# Patient Record
Sex: Female | Born: 1970 | Hispanic: No | Marital: Single | Smoking: Never smoker
Health system: Southern US, Community
[De-identification: ages and names within clinical notes are randomized; demographics above are authoritative.]

## PROBLEM LIST (undated history)

## (undated) DIAGNOSIS — F419 Anxiety disorder, unspecified: Secondary | ICD-10-CM

## (undated) HISTORY — PX: WISDOM TOOTH EXTRACTION: SHX21

---

## 1999-07-17 ENCOUNTER — Encounter: Payer: Self-pay | Admitting: Obstetrics and Gynecology

## 1999-07-17 ENCOUNTER — Encounter: Admission: RE | Admit: 1999-07-17 | Discharge: 1999-07-17 | Payer: Self-pay | Admitting: Obstetrics and Gynecology

## 1999-07-19 ENCOUNTER — Other Ambulatory Visit: Admission: RE | Admit: 1999-07-19 | Discharge: 1999-07-19 | Payer: Self-pay | Admitting: Obstetrics and Gynecology

## 1999-07-29 ENCOUNTER — Inpatient Hospital Stay (HOSPITAL_COMMUNITY): Admission: EM | Admit: 1999-07-29 | Discharge: 1999-07-29 | Payer: Self-pay | Admitting: Obstetrics and Gynecology

## 2004-09-23 ENCOUNTER — Ambulatory Visit (HOSPITAL_COMMUNITY): Admission: RE | Admit: 2004-09-23 | Discharge: 2004-09-23 | Payer: Self-pay | Admitting: Obstetrics & Gynecology

## 2004-10-25 ENCOUNTER — Ambulatory Visit (HOSPITAL_COMMUNITY): Admission: RE | Admit: 2004-10-25 | Discharge: 2004-10-25 | Payer: Self-pay | Admitting: Preventative Medicine

## 2011-08-07 ENCOUNTER — Emergency Department (HOSPITAL_COMMUNITY)
Admission: EM | Admit: 2011-08-07 | Discharge: 2011-08-07 | Disposition: A | Discharge status: Home / Self Care | Payer: Medicaid Other | Attending: Emergency Medicine | Admitting: Emergency Medicine

## 2011-08-07 ENCOUNTER — Encounter (HOSPITAL_COMMUNITY): Payer: Self-pay | Admitting: *Deleted

## 2011-08-07 DIAGNOSIS — R197 Diarrhea, unspecified: Secondary | ICD-10-CM | POA: Insufficient documentation

## 2011-08-07 DIAGNOSIS — K529 Noninfective gastroenteritis and colitis, unspecified: Secondary | ICD-10-CM

## 2011-08-07 DIAGNOSIS — R1110 Vomiting, unspecified: Secondary | ICD-10-CM | POA: Insufficient documentation

## 2011-08-07 DIAGNOSIS — R109 Unspecified abdominal pain: Secondary | ICD-10-CM | POA: Insufficient documentation

## 2011-08-07 DIAGNOSIS — R509 Fever, unspecified: Secondary | ICD-10-CM | POA: Insufficient documentation

## 2011-08-07 DIAGNOSIS — K5289 Other specified noninfective gastroenteritis and colitis: Secondary | ICD-10-CM | POA: Insufficient documentation

## 2011-08-07 LAB — CBC
HCT: 38.2 % (ref 36.0–46.0)
Hemoglobin: 12.7 g/dL (ref 12.0–15.0)
MCH: 29.4 pg (ref 26.0–34.0)
MCHC: 33.2 g/dL (ref 30.0–36.0)
MCV: 88.4 fL (ref 78.0–100.0)
Platelets: 224 10*3/uL (ref 150–400)
RBC: 4.32 MIL/uL (ref 3.87–5.11)
RDW: 13.8 % (ref 11.5–15.5)
WBC: 10.8 10*3/uL — ABNORMAL HIGH (ref 4.0–10.5)

## 2011-08-07 LAB — DIFFERENTIAL
Basophils Absolute: 0 10*3/uL (ref 0.0–0.1)
Basophils Relative: 0 % (ref 0–1)
Eosinophils Absolute: 0 10*3/uL (ref 0.0–0.7)
Eosinophils Relative: 0 % (ref 0–5)
Lymphocytes Relative: 6 % — ABNORMAL LOW (ref 12–46)
Lymphs Abs: 0.7 10*3/uL (ref 0.7–4.0)
Monocytes Absolute: 0.3 10*3/uL (ref 0.1–1.0)
Monocytes Relative: 2 % — ABNORMAL LOW (ref 3–12)
Neutro Abs: 9.9 10*3/uL — ABNORMAL HIGH (ref 1.7–7.7)
Neutrophils Relative %: 92 % — ABNORMAL HIGH (ref 43–77)

## 2011-08-07 LAB — COMPREHENSIVE METABOLIC PANEL
AST: 56 U/L — ABNORMAL HIGH (ref 0–37)
CO2: 26 mEq/L (ref 19–32)
Calcium: 9.5 mg/dL (ref 8.4–10.5)
Creatinine, Ser: 0.74 mg/dL (ref 0.50–1.10)
GFR calc Af Amer: >90 mL/min (ref >90)
GFR calc non Af Amer: >90 mL/min (ref >90)
Sodium: 136 mEq/L (ref 135–145)
Total Protein: 7.2 g/dL (ref 6.0–8.3)

## 2011-08-07 LAB — LIPASE, BLOOD: Lipase: 13 U/L (ref 11–59)

## 2011-08-07 MED ORDER — ONDANSETRON HCL 4 MG/2ML IJ SOLN
4.0000 mg | Freq: Once | INTRAMUSCULAR | Status: AC
  Administered 2011-08-07: 4 mg via INTRAVENOUS
  Filled 2011-08-07: qty 2

## 2011-08-07 MED ORDER — PANTOPRAZOLE SODIUM 40 MG IV SOLR
40.0000 mg | Freq: Once | INTRAVENOUS | Status: AC
Start: 2011-08-07 — End: 2011-08-07
  Administered 2011-08-07: 40 mg via INTRAVENOUS
  Filled 2011-08-07: qty 40

## 2011-08-07 MED ORDER — ACETAMINOPHEN 500 MG PO TABS
1000.0000 mg | ORAL_TABLET | Freq: Once | ORAL | Status: AC
  Administered 2011-08-07: 1000 mg via ORAL
  Filled 2011-08-07: qty 2

## 2011-08-07 MED ORDER — TRAMADOL HCL 50 MG PO TABS: 50.0000 mg | ORAL_TABLET | Freq: Four times a day (QID) | ORAL | Status: AC | PRN

## 2011-08-07 MED ORDER — PROMETHAZINE HCL 25 MG PO TABS: 25.0000 mg | ORAL_TABLET | Freq: Four times a day (QID) | ORAL | Status: AC | PRN

## 2011-08-07 MED ORDER — RANITIDINE HCL 150 MG PO CAPS: 150.0000 mg | ORAL_CAPSULE | Freq: Two times a day (BID) | ORAL | Status: DC

## 2011-08-07 MED ORDER — HYDROMORPHONE HCL PF 1 MG/ML IJ SOLN
1.0000 mg | Freq: Once | INTRAMUSCULAR | Status: AC
  Administered 2011-08-07: 1 mg via INTRAVENOUS
  Filled 2011-08-07: qty 1

## 2011-08-07 MED ORDER — SODIUM CHLORIDE 0.9 % IV BOLUS (SEPSIS)
1000.0000 mL | Freq: Once | INTRAVENOUS | Status: AC
  Administered 2011-08-07: 1000 mL via INTRAVENOUS

## 2011-08-07 NOTE — Discharge Instructions (Signed)
Follow up with your md next week if necessary

## 2011-08-07 NOTE — ED Notes (Signed)
Vomiting and diarrhea. Onset this am.  abd and chest pain

## 2011-08-07 NOTE — ED Provider Notes (Signed)
History    This chart was scribed for Benny Lennert, MD, MD by Smitty Pluck. The patient was seen in room APA12 and the patient's care was started at 6:58PM.   CSN: [id number]  Arrival date & time 08/07/11  1827   First MD Initiated Contact with Patient 08/07/11 1855      Chief Complaint  Patient presents with  . Emesis    (Consider location/radiation/quality/duration/timing/severity/associated sxs/prior treatment) Patient is a 41 y.o. female presenting with vomiting and diarrhea. The history is provided by the patient.  Emesis  This is a new problem. The current episode started 6 to 12 hours ago. The problem has not changed since onset.The fever has been present for less than 1 day. Associated symptoms include chills, diarrhea and a fever.  Diarrhea The primary symptoms include fever, vomiting and diarrhea. The illness began today. The onset was sudden. The problem has not changed since onset. The diarrhea began today. The diarrhea is watery. The diarrhea occurs 2 to 4 times per day.  The illness is also significant for chills.   Brandi Cross is a 41 y.o. female who presents to the Emergency Department complaining of persistent emesis onset today. Pt reports that she had fever and chills. Pt has had diarrhea, moderate abdominal pain and aching chest pain. Pt has not taken medication for pain. She reports that she has not eaten any food today. She denies ill contacts. The symptoms have been constant since onset without radiation.  PCP is Dr. Augusto Gamble   History reviewed. No pertinent past medical history.  History reviewed. No pertinent past surgical history.  History reviewed. No pertinent family history.  History  Substance Use Topics  . Smoking status: Never Smoker   . Smokeless tobacco: Not on file  . Alcohol Use: No    OB History    Grav Para Term Preterm Abortions TAB SAB Ect Mult Living                  Review of Systems  Constitutional: Positive for fever and  chills.  Gastrointestinal: Positive for vomiting and diarrhea.  All other systems reviewed and are negative.  10 Systems reviewed and are negative for acute change except as noted in the HPI.   Allergies  Review of patient's allergies indicates no known allergies.  Home Medications  No current outpatient prescriptions on file.  BP 127/85  Pulse 110  Temp(Src) 98.9 F (37.2 C) (Oral)  Resp 20  Ht 5\' 3"  (1.6 m)  Wt 172 lb (78.019 kg)  BMI 30.47 kg/m2  SpO2 99%  LMP 07/21/2011  Physical Exam  Constitutional: She is oriented to person, place, and time. She appears well-developed.  HENT:  Head: Normocephalic and atraumatic.  Eyes: Conjunctivae and EOM are normal. No scleral icterus.  Neck: Neck supple. No thyromegaly present.  Cardiovascular: Normal rate and regular rhythm.  Exam reveals no gallop and no friction rub.   No murmur heard. Pulmonary/Chest: No stridor. She has no wheezes. She has no rales. She exhibits no tenderness.  Abdominal: She exhibits no distension. There is tenderness (mild epigastric discomfort ). There is no rebound.  Musculoskeletal: Normal range of motion. She exhibits no edema.  Lymphadenopathy:    She has no cervical adenopathy.  Neurological: She is oriented to person, place, and time. Coordination normal.  Skin: No rash noted. No erythema.  Psychiatric: She has a normal mood and affect. Her behavior is normal.    ED Course  Procedures (including critical  care time) DIAGNOSTIC STUDIES: Oxygen Saturation is 99% on room air, normal by my interpretation.    COORDINATION OF CARE: 7:03PM EDP discusses pt treatment course with pt and pt family  7:03PM EDP orders medication: NaCl 0.9% bolus, Zofran 4 mg, Dilaudid 1 mg, Protonix 40 mg     Labs Reviewed  CBC - Abnormal; Notable for the following:    WBC 10.8 (*)    All other components within normal limits  DIFFERENTIAL - Abnormal; Notable for the following:    Neutrophils Relative 92 (*)     Neutro Abs 9.9 (*)    Lymphocytes Relative 6 (*)    Monocytes Relative 2 (*)    All other components within normal limits  COMPREHENSIVE METABOLIC PANEL - Abnormal; Notable for the following:    Glucose, Bld 105 (*)    AST 56 (*)    ALT 64 (*)    All other components within normal limits  LIPASE, BLOOD   No results found.   No diagnosis found.    MDM   The chart was scribed for me under my direct supervision.  I personally performed the history, physical, and medical decision making and all procedures in the evaluation of this patient.Benny Lennert, MD 08/07/11 2134

## 2012-01-17 ENCOUNTER — Emergency Department (HOSPITAL_COMMUNITY)
Admission: EM | Admit: 2012-01-17 | Discharge: 2012-01-17 | Disposition: A | Discharge status: Home / Self Care | Payer: Medicaid Other | Attending: Emergency Medicine | Admitting: Emergency Medicine

## 2012-01-17 ENCOUNTER — Encounter (HOSPITAL_COMMUNITY): Payer: Self-pay

## 2012-01-17 DIAGNOSIS — N61 Mastitis without abscess: Secondary | ICD-10-CM | POA: Insufficient documentation

## 2012-01-17 DIAGNOSIS — Z79899 Other long term (current) drug therapy: Secondary | ICD-10-CM | POA: Insufficient documentation

## 2012-01-17 HISTORY — DX: Anxiety disorder, unspecified: F41.9

## 2012-01-17 LAB — BASIC METABOLIC PANEL
CO2: 27 mEq/L (ref 19–32)
Chloride: 93 mEq/L — ABNORMAL LOW (ref 96–112)
Glucose, Bld: 103 mg/dL — ABNORMAL HIGH (ref 70–99)
Sodium: 129 mEq/L — ABNORMAL LOW (ref 135–145)

## 2012-01-17 LAB — CBC WITH DIFFERENTIAL/PLATELET
Eosinophils Relative: 0 % (ref 0–5)
HCT: 37 % (ref 36.0–46.0)
Hemoglobin: 12.1 g/dL (ref 12.0–15.0)
Lymphocytes Relative: 9 % — ABNORMAL LOW (ref 12–46)
Lymphs Abs: 1.4 10*3/uL (ref 0.7–4.0)
MCV: 87.7 fL (ref 78.0–100.0)
Monocytes Absolute: 0.6 10*3/uL (ref 0.1–1.0)
Monocytes Relative: 4 % (ref 3–12)
RBC: 4.22 MIL/uL (ref 3.87–5.11)
WBC: 16.6 10*3/uL — ABNORMAL HIGH (ref 4.0–10.5)

## 2012-01-17 LAB — LACTIC ACID, PLASMA: Lactic Acid, Venous: 0.6 mmol/L (ref 0.5–2.2)

## 2012-01-17 LAB — POCT I-STAT TROPONIN I: Troponin i, poc: 0 ng/mL (ref 0.00–0.08)

## 2012-01-17 MED ORDER — METOCLOPRAMIDE HCL 10 MG PO TABS: 10.0000 mg | ORAL_TABLET | Freq: Four times a day (QID) | ORAL | Status: DC | PRN

## 2012-01-17 MED ORDER — OXYCODONE-ACETAMINOPHEN 5-325 MG PO TABS: 2.0000 | ORAL_TABLET | ORAL | Status: AC | PRN

## 2012-01-17 MED ORDER — ONDANSETRON 8 MG PO TBDP: ORAL_TABLET | ORAL | Status: AC

## 2012-01-17 MED ORDER — SULFAMETHOXAZOLE-TRIMETHOPRIM 800-160 MG PO TABS: 1.0000 | ORAL_TABLET | Freq: Two times a day (BID) | ORAL | Status: AC

## 2012-01-17 MED ORDER — VANCOMYCIN HCL IN DEXTROSE 1-5 GM/200ML-% IV SOLN
1000.0000 mg | Freq: Once | INTRAVENOUS | Status: AC
  Administered 2012-01-17: 1000 mg via INTRAVENOUS
  Filled 2012-01-17: qty 200

## 2012-01-17 MED ORDER — SODIUM CHLORIDE 0.9 % IV SOLN: INTRAVENOUS | Status: DC

## 2012-01-17 MED ORDER — SODIUM CHLORIDE 0.9 % IV BOLUS (SEPSIS)
2000.0000 mL | Freq: Once | INTRAVENOUS | Status: AC
  Administered 2012-01-17: 2000 mL via INTRAVENOUS

## 2012-01-17 MED ORDER — CEPHALEXIN 500 MG PO CAPS: ORAL_CAPSULE | ORAL | Status: AC

## 2012-01-17 MED ORDER — ACETAMINOPHEN 325 MG PO TABS
650.0000 mg | ORAL_TABLET | Freq: Once | ORAL | Status: AC
  Administered 2012-01-17: 650 mg via ORAL
  Filled 2012-01-17: qty 2

## 2012-01-17 MED ORDER — ONDANSETRON HCL 4 MG/2ML IJ SOLN
4.0000 mg | Freq: Once | INTRAMUSCULAR | Status: AC
  Administered 2012-01-17: 4 mg via INTRAVENOUS
  Filled 2012-01-17: qty 2

## 2012-01-17 MED ORDER — HYDROMORPHONE HCL PF 1 MG/ML IJ SOLN
1.0000 mg | Freq: Once | INTRAMUSCULAR | Status: AC
  Administered 2012-01-17: 1 mg via INTRAVENOUS
  Filled 2012-01-17: qty 1

## 2012-01-17 NOTE — ED Notes (Signed)
Pt reports having left breast pain since last night, pain never goes away, +chills, +nausea and vomiting.   Denies any known injury to area. Denies sob.

## 2012-01-17 NOTE — ED Notes (Signed)
Pt c/o pain to left breast x1 day. Pt states pain is constant. Left breast has redness and is warm to touch.

## 2012-01-17 NOTE — ED Provider Notes (Signed)
History     CSN: [id number]  Arrival date & time 01/17/12  1352   First MD Initiated Contact with Patient 01/17/12 1423      Chief Complaint  Patient presents with  . Breast Pain    (Consider location/radiation/quality/duration/timing/severity/associated sxs/prior treatment) HPI This 41 year old female woke up this morning and for the morning with shaking chills, generalized body aches, redness swelling tenderness and firmness to her left breast soft tissue, and some nausea with one episode of nonbloody vomiting. She no diarrhea no abdominal pain no chest pain other than her left breast soft tissue only, no cough or shortness breath, no lightheadedness or altered mental status or headache, no rashes other than her left breast skin. There is no treatment prior to arrival. She has had constant moderately severe left breast sharp tender nonradiating localized pain since 4:00 this morning for over 10 hours now. There is no pleuritic or exertional discomfort. Past Medical History  Diagnosis Date  . Anxiety   . Attention deficit     History reviewed. No pertinent past surgical history.  No family history on file.  History  Substance Use Topics  . Smoking status: Never Smoker   . Smokeless tobacco: Not on file  . Alcohol Use: No    OB History    Grav Para Term Preterm Abortions TAB SAB Ect Mult Living                  Review of Systems 10 Systems reviewed and are negative for acute change except as noted in the HPI. Allergies  Review of patient's allergies indicates no known allergies.  Home Medications   Current Outpatient Rx  Name Route Sig Dispense Refill  . NAPROXEN SODIUM 220 MG PO TABS Oral Take 440 mg by mouth as needed. pain    . CEPHALEXIN 500 MG PO CAPS  2 caps po bid x 7 days 28 capsule 0  . METOCLOPRAMIDE HCL 10 MG PO TABS Oral Take 1 tablet (10 mg total) by mouth every 6 (six) hours as needed (nausea/headache). 6 tablet 0  . ONDANSETRON 8 MG PO TBDP  8mg  ODT  q4 hours prn nausea 4 tablet 0  . OXYCODONE-ACETAMINOPHEN 5-325 MG PO TABS Oral Take 2 tablets by mouth every 4 (four) hours as needed for pain. 20 tablet 0  . SULFAMETHOXAZOLE-TRIMETHOPRIM 800-160 MG PO TABS Oral Take 1 tablet by mouth 2 (two) times daily. One po bid x 7 days 14 tablet 0    BP 130/104  Pulse 118  Temp 98.4 F (36.9 C) (Oral)  Resp 20  Ht 5\' 3"  (1.6 m)  Wt 163 lb (73.936 kg)  BMI 28.87 kg/m2  SpO2 100%  LMP 12/25/2011  Physical Exam  Nursing note and vitals reviewed. Constitutional:       Awake, alert, nontoxic appearance.  HENT:  Head: Atraumatic.  Eyes: Right eye exhibits no discharge. Left eye exhibits no discharge.  Neck: Neck supple.  Cardiovascular: Regular rhythm.   No murmur heard.      Tachycardic  Pulmonary/Chest: Effort normal and breath sounds normal. No respiratory distress. She has no wheezes. She has no rales. She exhibits no tenderness.       Chaperone is present for breast examination revealing nontender right breast, left breast has erythema induration warmth tenderness firmness without fluctuance and limited bedside emergency ultrasound reveals no obvious soft tissue abscess  Abdominal: Soft. There is no tenderness. There is no rebound.  Musculoskeletal: She exhibits no edema and no tenderness.  Baseline ROM, no obvious new focal weakness.  Neurological: She is alert.       Mental status and motor strength appears baseline for patient and situation.  Skin: No rash noted.  Psychiatric: She has a normal mood and affect.    ED Course  Procedures (including critical care time) ECG: Sinus tachycardia, ventricular rate 109, normal axis, normal intervals, diffuse inverted T waves anterolateral leads with no comparison ECG available Labs Reviewed  BASIC METABOLIC PANEL - Abnormal; Notable for the following:    Sodium 129 (*)     Chloride 93 (*)     Glucose, Bld 103 (*)     All other components within normal limits  CBC WITH DIFFERENTIAL  - Abnormal; Notable for the following:    WBC 16.6 (*)     Neutrophils Relative 88 (*)     Neutro Abs 14.5 (*)     Lymphocytes Relative 9 (*)     All other components within normal limits  CULTURE, BLOOD (ROUTINE X 2)  CULTURE, BLOOD (ROUTINE X 2)  LACTIC ACID, PLASMA  POCT I-STAT TROPONIN I   No results found.   1. Cellulitis of breast       MDM  Pt stable in ED with no significant deterioration in condition.Patient / Family / Caregiver informed of clinical course, understand medical decision-making process, and agree with plan.Doubt sepsis/abscess.        Hurman Horn, MD 01/17/12 [phone]

## 2012-01-22 LAB — CULTURE, BLOOD (ROUTINE X 2): Culture: NO GROWTH

## 2012-07-11 ENCOUNTER — Encounter (HOSPITAL_COMMUNITY): Payer: Self-pay | Admitting: Emergency Medicine

## 2012-07-11 ENCOUNTER — Emergency Department (HOSPITAL_COMMUNITY)
Admission: EM | Admit: 2012-07-11 | Discharge: 2012-07-12 | Disposition: A | Discharge status: Home / Self Care | Payer: Medicaid Other | Attending: Emergency Medicine | Admitting: Emergency Medicine

## 2012-07-11 DIAGNOSIS — Y939 Activity, unspecified: Secondary | ICD-10-CM | POA: Insufficient documentation

## 2012-07-11 DIAGNOSIS — T50901A Poisoning by unspecified drugs, medicaments and biological substances, accidental (unintentional), initial encounter: Secondary | ICD-10-CM

## 2012-07-11 DIAGNOSIS — T424X4A Poisoning by benzodiazepines, undetermined, initial encounter: Secondary | ICD-10-CM | POA: Insufficient documentation

## 2012-07-11 DIAGNOSIS — T424X1A Poisoning by benzodiazepines, accidental (unintentional), initial encounter: Secondary | ICD-10-CM | POA: Insufficient documentation

## 2012-07-11 DIAGNOSIS — R41840 Attention and concentration deficit: Secondary | ICD-10-CM | POA: Insufficient documentation

## 2012-07-11 DIAGNOSIS — Y929 Unspecified place or not applicable: Secondary | ICD-10-CM | POA: Insufficient documentation

## 2012-07-11 DIAGNOSIS — F411 Generalized anxiety disorder: Secondary | ICD-10-CM | POA: Insufficient documentation

## 2012-07-11 LAB — CBC WITH DIFFERENTIAL/PLATELET
Basophils Relative: 1 % (ref 0–1)
Eosinophils Absolute: 0.1 10*3/uL (ref 0.0–0.7)
Eosinophils Relative: 1 % (ref 0–5)
HCT: 36.3 % (ref 36.0–46.0)
Hemoglobin: 11.8 g/dL — ABNORMAL LOW (ref 12.0–15.0)
Lymphs Abs: 2.3 10*3/uL (ref 0.7–4.0)
MCH: 28.7 pg (ref 26.0–34.0)
MCHC: 32.5 g/dL (ref 30.0–36.0)
MCV: 88.3 fL (ref 78.0–100.0)
Monocytes Absolute: 0.3 10*3/uL (ref 0.1–1.0)
Monocytes Relative: 5 % (ref 3–12)
Neutrophils Relative %: 53 % (ref 43–77)

## 2012-07-11 LAB — BASIC METABOLIC PANEL
BUN: 13 mg/dL (ref 6–23)
CO2: 26 mEq/L (ref 19–32)
GFR calc non Af Amer: >90 mL/min (ref >90)
Glucose, Bld: 104 mg/dL — ABNORMAL HIGH (ref 70–99)
Potassium: 3.5 mEq/L (ref 3.5–5.1)

## 2012-07-11 NOTE — ED Provider Notes (Signed)
History     CSN: [id number]  Arrival date & time 07/11/12  2218   First MD Initiated Contact with Patient 07/11/12 2306      Chief Complaint  Patient presents with  . Drug Overdose    (Consider location/radiation/quality/duration/timing/severity/associated sxs/prior treatment) HPI Brandi Cross is a 42 y.o. female who presents to the Emergency Department complaining of overdose of ativan (two pills) and melatonin (six pills) in an attempt to sleep. She got into a fight with a boyfriend and decided the best way to treat it was to go to sleep. She has a h/o anxiety. She denies suicidal ideation, homicidal ideation and is not having AVH.  PCP Hasanaj  Past Medical History  Diagnosis Date  . Anxiety   . Attention deficit     History reviewed. No pertinent past surgical history.  No family history on file.  History  Substance Use Topics  . Smoking status: Never Smoker   . Smokeless tobacco: Not on file  . Alcohol Use: No    OB History   Grav Para Term Preterm Abortions TAB SAB Ect Mult Living                  Review of Systems  Constitutional: Negative for fever.       10 Systems reviewed and are negative for acute change except as noted in the HPI.  HENT: Negative for congestion.   Eyes: Negative for discharge and redness.  Respiratory: Negative for cough and shortness of breath.   Cardiovascular: Negative for chest pain.  Gastrointestinal: Negative for vomiting and abdominal pain.  Musculoskeletal: Negative for back pain.  Skin: Negative for rash.  Neurological: Negative for syncope, numbness and headaches.  Psychiatric/Behavioral:       No behavior change.    Allergies  Review of patient's allergies indicates no known allergies.  Home Medications   Current Outpatient Rx  Name  Route  Sig  Dispense  Refill  . EXPIRED: metoCLOPramide (REGLAN) 10 MG tablet   Oral   Take 1 tablet (10 mg total) by mouth every 6 (six) hours as needed (nausea/headache).   6  tablet   0   . naproxen sodium (ALEVE) 220 MG tablet   Oral   Take 440 mg by mouth as needed. pain           BP 128/88  Pulse 79  Temp(Src) 97.9 F (36.6 C) (Oral)  Resp 18  SpO2 99%  Physical Exam  Nursing note and vitals reviewed. Constitutional:  Awake, alert, nontoxic appearance.  HENT:  Head: Atraumatic.  Eyes: Right eye exhibits no discharge. Left eye exhibits no discharge.  Neck: Neck supple.  Cardiovascular: Normal rate.   Pulmonary/Chest: Effort normal and breath sounds normal. She exhibits no tenderness.  Abdominal: Soft. Bowel sounds are normal. There is no tenderness. There is no rebound.  Musculoskeletal: She exhibits no tenderness.  Baseline ROM, no obvious new focal weakness.  Neurological:  Mental status and motor strength appears baseline for patient and situation.  Skin: No rash noted.  Psychiatric: She has a normal mood and affect.  Denies suicidal idation, homicidal ideation, AVH    ED Course  Procedures (including critical care time)  Results for orders placed during the hospital encounter of 07/11/12  BASIC METABOLIC PANEL      Result Value Range   Sodium 139  135 - 145 mEq/L   Potassium 3.5  3.5 - 5.1 mEq/L   Chloride 104  96 - 112 mEq/L  CO2 26  19 - 32 mEq/L   Glucose, Bld 104 (*) 70 - 99 mg/dL   BUN 13  6 - 23 mg/dL   Creatinine, Ser 1.61  0.50 - 1.10 mg/dL   Calcium 9.3  8.4 - 09.6 mg/dL   GFR calc non Af Amer >90  >90 mL/min   GFR calc Af Amer >90  >90 mL/min  CBC WITH DIFFERENTIAL      Result Value Range   WBC 5.9  4.0 - 10.5 K/uL   RBC 4.11  3.87 - 5.11 MIL/uL   Hemoglobin 11.8 (*) 12.0 - 15.0 g/dL   HCT 04.5  40.9 - 81.1 %   MCV 88.3  78.0 - 100.0 fL   MCH 28.7  26.0 - 34.0 pg   MCHC 32.5  30.0 - 36.0 g/dL   RDW 91.4  78.2 - 95.6 %   Platelets 221  150 - 400 K/uL   Neutrophils Relative 53  43 - 77 %   Neutro Abs 3.1  1.7 - 7.7 K/uL   Lymphocytes Relative 39  12 - 46 %   Lymphs Abs 2.3  0.7 - 4.0 K/uL   Monocytes  Relative 5  3 - 12 %   Monocytes Absolute 0.3  0.1 - 1.0 K/uL   Eosinophils Relative 1  0 - 5 %   Eosinophils Absolute 0.1  0.0 - 0.7 K/uL   Basophils Relative 1  0 - 1 %   Basophils Absolute 0.1  0.0 - 0.1 K/uL  ETHANOL      Result Value Range   Alcohol, Ethyl (B) <11  0 - 11 mg/dL  URINE RAPID DRUG SCREEN (HOSP PERFORMED)      Result Value Range   Opiates NONE DETECTED  NONE DETECTED   Cocaine NONE DETECTED  NONE DETECTED   Benzodiazepines POSITIVE (*) NONE DETECTED   Amphetamines POSITIVE (*) NONE DETECTED   Tetrahydrocannabinol NONE DETECTED  NONE DETECTED   Barbiturates NONE DETECTED  NONE DETECTED      MDM  Patient with OD of ativan and melatonin. Patient does not meet criteria for admission as she is not suicidal, homicidal or having AVH. Pt stable in ED with no significant deterioration in condition.The patient appears reasonably screened and/or stabilized for discharge and I doubt any other medical condition or other [facility] requiring further screening, evaluation, or treatment in the ED at this time prior to discharge.  MDM Reviewed: nursing note and vitals Interpretation: labs           Nicoletta Dress. Colon Branch, MD 07/12/12 0100

## 2012-07-11 NOTE — ED Notes (Signed)
Patient presents to ER via EMS after taking unknown amount of Ativan and Melatonin.  Per EMS, family reported that patient told them she wanted to hurt herself.  Patient told EMS she just wanted to sleep because she had an argument with her boyfriend.

## 2012-07-12 LAB — RAPID URINE DRUG SCREEN, HOSP PERFORMED
Amphetamines: POSITIVE — AB
Barbiturates: NOT DETECTED
Tetrahydrocannabinol: NOT DETECTED

## 2012-07-12 NOTE — ED Notes (Signed)
Patient wants to know how much longer she has to stay. Stating that she wants to go home. Asking if her family is here.

## 2012-08-16 ENCOUNTER — Encounter (HOSPITAL_COMMUNITY): Payer: Self-pay | Admitting: *Deleted

## 2012-08-16 ENCOUNTER — Emergency Department (HOSPITAL_COMMUNITY)
Admission: EM | Admit: 2012-08-16 | Discharge: 2012-08-17 | Disposition: A | Discharge status: Other Acute Inpatient Hospital | Payer: MEDICAID | Source: Home / Self Care | Attending: Emergency Medicine | Admitting: Emergency Medicine

## 2012-08-16 DIAGNOSIS — F329 Major depressive disorder, single episode, unspecified: Secondary | ICD-10-CM | POA: Insufficient documentation

## 2012-08-16 DIAGNOSIS — F411 Generalized anxiety disorder: Secondary | ICD-10-CM | POA: Insufficient documentation

## 2012-08-16 DIAGNOSIS — Z8659 Personal history of other mental and behavioral disorders: Secondary | ICD-10-CM | POA: Insufficient documentation

## 2012-08-16 DIAGNOSIS — F1910 Other psychoactive substance abuse, uncomplicated: Secondary | ICD-10-CM | POA: Insufficient documentation

## 2012-08-16 LAB — CBC WITH DIFFERENTIAL/PLATELET
Basophils Absolute: 0 10*3/uL (ref 0.0–0.1)
Eosinophils Absolute: 0 10*3/uL (ref 0.0–0.7)
Eosinophils Relative: 0 % (ref 0–5)
Lymphocytes Relative: 23 % (ref 12–46)
Lymphs Abs: 1.2 10*3/uL (ref 0.7–4.0)
MCV: 87.6 fL (ref 78.0–100.0)
Neutrophils Relative %: 72 % (ref 43–77)
Platelets: 274 10*3/uL (ref 150–400)
RBC: 4.58 MIL/uL (ref 3.87–5.11)
RDW: 13.2 % (ref 11.5–15.5)
WBC: 5.4 10*3/uL (ref 4.0–10.5)

## 2012-08-16 LAB — COMPREHENSIVE METABOLIC PANEL
ALT: 17 U/L (ref 0–35)
AST: 20 U/L (ref 0–37)
Alkaline Phosphatase: 66 U/L (ref 39–117)
CO2: 29 mEq/L (ref 19–32)
Calcium: 9.6 mg/dL (ref 8.4–10.5)
Glucose, Bld: 110 mg/dL — ABNORMAL HIGH (ref 70–99)
Potassium: 3.9 mEq/L (ref 3.5–5.1)
Sodium: 138 mEq/L (ref 135–145)
Total Protein: 7.2 g/dL (ref 6.0–8.3)

## 2012-08-16 LAB — URINALYSIS, ROUTINE W REFLEX MICROSCOPIC
Nitrite: NEGATIVE
Protein, ur: NEGATIVE mg/dL
Specific Gravity, Urine: 1.015 (ref 1.005–1.030)
Urobilinogen, UA: 0.2 mg/dL (ref 0.0–1.0)

## 2012-08-16 LAB — RAPID URINE DRUG SCREEN, HOSP PERFORMED
Amphetamines: NOT DETECTED
Barbiturates: NOT DETECTED
Benzodiazepines: NOT DETECTED
Tetrahydrocannabinol: NOT DETECTED

## 2012-08-16 LAB — ACETAMINOPHEN LEVEL: Acetaminophen (Tylenol), Serum: <15 ug/mL (ref 10–30)

## 2012-08-16 LAB — ETHANOL: Alcohol, Ethyl (B): <11 mg/dL (ref 0–11)

## 2012-08-16 MED ORDER — ACETAMINOPHEN 325 MG PO TABS
650.0000 mg | ORAL_TABLET | ORAL | Status: DC | PRN
  Administered 2012-08-16: 650 mg via ORAL
  Filled 2012-08-16: qty 2

## 2012-08-16 MED ORDER — ONDANSETRON HCL 4 MG PO TABS: 4.0000 mg | ORAL_TABLET | Freq: Three times a day (TID) | ORAL | Status: DC | PRN

## 2012-08-16 MED ORDER — ZOLPIDEM TARTRATE 5 MG PO TABS: 5.0000 mg | ORAL_TABLET | Freq: Every evening | ORAL | Status: DC | PRN

## 2012-08-16 NOTE — ED Provider Notes (Signed)
History     CSN: [id number]  Arrival date & time 08/16/12  1336   First MD Initiated Contact with Patient 08/16/12 1402      Chief Complaint  Patient presents with  . V70.1    (Consider location/radiation/quality/duration/timing/severity/associated sxs/prior treatment) HPI Comments: Patient comes to the ER in custody of [place] [employer]. An involuntary commitment has been initiated by her family. Patient has reportedly been overdosing on medications. Patient's daughter reports that the patient has stolen her pain medications and sleeping pills. The family also reports that the patient is still on her own mother's Xanax. They report that she stays in bed all day and then "pops pills" whenever she is awake. Family reports that she has been angry and belligerent, sometimes abusive. Family thinks that a lot of her problems are secondary to her boyfriend who also has a drug addiction problem.  Upon arrival to the ER, the patient denies any homicidality or suicidality. She does admit to overdosing 2 weeks ago, at which time she was seen and evaluated in the ER here. She does not admit to taking any of the other medications that were alleged by the family.   Past Medical History  Diagnosis Date  . Anxiety     pt denies  . Attention deficit     pt denies    History reviewed. No pertinent past surgical history.  No family history on file.  History  Substance Use Topics  . Smoking status: Never Smoker   . Smokeless tobacco: Not on file  . Alcohol Use: No    OB History   Grav Para Term Preterm Abortions TAB SAB Ect Mult Living                  Review of Systems  Psychiatric/Behavioral: Positive for dysphoric mood.  All other systems reviewed and are negative.    Allergies  Review of patient's allergies indicates no known allergies.  Home Medications  No current outpatient prescriptions on file.  BP 143/99  Pulse 76  Temp(Src) 97.9 F (36.6 C) (Oral)   Resp 18  Ht 5\' 3"  (1.6 m)  Wt 160 lb (72.576 kg)  BMI 28.35 kg/m2  LMP 08/14/2012  Physical Exam  Constitutional: She is oriented to person, place, and time. She appears well-developed and well-nourished. No distress.  HENT:  Head: Normocephalic and atraumatic.  Right Ear: Hearing normal.  Nose: Nose normal.  Mouth/Throat: Oropharynx is clear and moist and mucous membranes are normal.  Eyes: Conjunctivae and EOM are normal. Pupils are equal, round, and reactive to light.  Neck: Normal range of motion. Neck supple.  Cardiovascular: Normal rate, regular rhythm, S1 normal and S2 normal.  Exam reveals no gallop and no friction rub.   No murmur heard. Pulmonary/Chest: Effort normal and breath sounds normal. No respiratory distress. She exhibits no tenderness.  Abdominal: Soft. Normal appearance and bowel sounds are normal. There is no hepatosplenomegaly. There is no tenderness. There is no rebound, no guarding, no tenderness at McBurney's point and negative Murphy's sign. No hernia.  Musculoskeletal: Normal range of motion.  Neurological: She is alert and oriented to person, place, and time. She has normal strength. No cranial nerve deficit or sensory deficit. Coordination normal. GCS eye subscore is 4. GCS verbal subscore is 5. GCS motor subscore is 6.  Skin: Skin is warm, dry and intact. No rash noted. No cyanosis.  Psychiatric: She has a normal mood and affect. Her speech is normal and behavior is  normal. Thought content normal.    ED Course  Procedures (including critical care time)  Labs Reviewed  URINALYSIS, ROUTINE W REFLEX MICROSCOPIC  CBC WITH DIFFERENTIAL  COMPREHENSIVE METABOLIC PANEL  URINE RAPID DRUG SCREEN (HOSP PERFORMED)  ETHANOL  ACETAMINOPHEN LEVEL  SALICYLATE LEVEL   No results found.   Diagnosis: 1. Polysubstance drug abuse 2. Depression    MDM  Patient brought to the ER by police for involuntary commitment. Patient denies any desire to harm herself or  others at this time. Family is present and they elected the patient has been out of control taking multiple medications, to the point of overdose. They also report that she has had severe anger problems and has been very depressed. They're concerned that if she leaves the ER she will harm herself. Patient will therefore undergo psychiatric evaluation for possible placement.        Gilda Crease, MD 08/16/12 [phone]

## 2012-08-16 NOTE — ED Notes (Signed)
Pt accepted at [facility].

## 2012-08-16 NOTE — ED Provider Notes (Signed)
  Patient interviewed by telemedicine psychiatry. Dr. Sunday I ago. Patient is recommended for psych inpatient hospitalization. The psychiatrist if they stated the patient is not psychiatrically stable. No no specific medication recommendations. 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  [place] papers will be completed on the patient and a relative and will continue looking for admittance to a psychiatric facility.    Shelda Jakes, MD 08/16/12 [phone]

## 2012-08-16 NOTE — ED Notes (Signed)
Here with [facility] under IVC - per papers, pt has been taking her daughter's and grandmother's medications, throwing things in the home, and has OD on OTC sleeping pills a couple of weeks ago.  Pt denies everything.  Denies SI/HI.  Pt's mother states that pt's daughter told her that they could "overdose together."  Pt denies.

## 2012-08-16 NOTE — ED Notes (Signed)
Pt's fiance at bedside with patient. Wanded by security prior to coming into the unit.

## 2012-08-16 NOTE — BH Assessment (Signed)
Assessment Note   Brandi Cross is an 42 y.o. female. The patient was brought  to the ED on IVC paper work completed by the sister. The papers state that the patient did make a suicide gesture 2 weeks  Ago. She was released after evaluation. The report is that she is stealing opiates from her disabled sister. She is also stealing Xanax from her grandmother. She is easily angered and throws things in the home. Her children live with the patients mother as she does not provide care for them. The patient denies SI and HI. She denies psychosis. She does not appear to be hallucinated nor delusional.   She denies any substance abuse. Due to conflicting histories, Dr Blinda Leatherwood has requested a tele-psych evaluation.  Axis I:  Mood Disorder NOS;Polysubstance Abuse Axis II: Deferred Axis III:  Past Medical History  Diagnosis Date  . Anxiety     pt denies  . Attention deficit     pt denies   Axis IV: economic problems, housing problems, problems related to social environment and problems with primary support group Axis V: 31-40 impairment in reality testing  Past Medical History:  Past Medical History  Diagnosis Date  . Anxiety     pt denies  . Attention deficit     pt denies    History reviewed. No pertinent past surgical history.  Family History: No family history on file.  Social History:  reports that she has never smoked. She does not have any smokeless tobacco history on file. She reports that she does not drink alcohol or use illicit drugs.  Additional Social History:     CIWA: CIWA-Ar BP: 131/79 mmHg Pulse Rate: 71 COWS:    Allergies: No Known Allergies  Home Medications:  (Not in a hospital admission)  OB/GYN Status:  Patient's last menstrual period was 08/14/2012.  General Assessment Data Location of Assessment: [facility] ACT Assessment: Yes Living Arrangements: Alone Can pt return to current living arrangement?: Yes Admission Status: Involuntary Is patient capable of  signing voluntary admission?: No Transfer from: Acute Hospital Referral Source: MD  Education Status Is patient currently in school?: No  Risk to self Suicidal Ideation: Yes-Currently Present Suicidal Intent: No-Not Currently/Within Last 6 Months Is patient at risk for suicide?: Yes Suicidal Plan?: No-Not Currently/Within Last 6 Months Access to Means: No What has been your use of drugs/alcohol within the last 12 months?: abusing prescription drugs Previous Attempts/Gestures: Yes How many times?: 1 Other Self Harm Risks: continues to abuse prescription drugs Triggers for Past Attempts: Unknown Intentional Self Injurious Behavior: None Family Suicide History: No Recent stressful life event(s): Conflict (Comment);Financial Problems;Turmoil (Comment) (conflict with family) Persecutory voices/beliefs?: No Depression: Yes Depression Symptoms: Insomnia;Tearfulness;Isolating;Loss of interest in usual pleasures;Feeling worthless/self pity;Feeling angry/irritable Substance abuse history and/or treatment for substance abuse?: Yes Suicide prevention information given to non-admitted patients: Not applicable  Risk to Others Homicidal Ideation: No Thoughts of Harm to Others: No Current Homicidal Intent: No Current Homicidal Plan: No Access to Homicidal Means: No History of harm to others?: No Assessment of Violence: None Noted Does patient have access to weapons?: No Criminal Charges Pending?: No Does patient have a court date: No  Psychosis Hallucinations: None noted Delusions: None noted  Mental Status Report Appear/Hygiene: Disheveled Eye Contact: Poor (covers her head with the sheet at times) Motor Activity: Agitation;Restlessness Speech: Logical/coherent Level of Consciousness: Restless;Sleeping;Crying;Irritable Mood: Depressed;Angry;Anhedonia;Irritable;Sad;Worthless, low self-esteem Affect: Appropriate to circumstance Anxiety Level: Minimal Thought Processes:  Coherent Judgement: Impaired Orientation: Person;Place;Time Obsessive Compulsive  Thoughts/Behaviors: None  Cognitive Functioning Concentration: Decreased Memory: Recent Intact;Remote Intact IQ: Average Insight: Poor Impulse Control: Poor Appetite: Fair Weight Loss: 0 Weight Gain: 0 Sleep: Decreased Total Hours of Sleep: 5 Vegetative Symptoms: Staying in bed  ADLScreening [facility] Assessment Services) Patient's cognitive ability adequate to safely complete daily activities?: Yes Patient able to express need for assistance with ADLs?: Yes Independently performs ADLs?: Yes (appropriate for developmental age)  Abuse/Neglect [facility]) Physical Abuse: Denies Verbal Abuse: Denies Sexual Abuse: Denies  Prior Inpatient Therapy Prior Inpatient Therapy: No  Prior Outpatient Therapy Prior Outpatient Therapy: No  ADL Screening (condition at time of admission) Patient's cognitive ability adequate to safely complete daily activities?: Yes Patient able to express need for assistance with ADLs?: Yes Independently performs ADLs?: Yes (appropriate for developmental age)       Abuse/Neglect Assessment (Assessment to be complete while patient is alone) Physical Abuse: Denies Verbal Abuse: Denies Sexual Abuse: Denies Values / Beliefs Cultural Requests During Hospitalization: None Spiritual Requests During Hospitalization: None        Additional Information 1:1 In Past 12 Months?: No CIRT Risk: No Elopement Risk: No Does patient have medical clearance?: Yes     Disposition: Patient to remain in ED until tele-psych completed. Disposition Initial Assessment Completed for this Encounter: Yes Disposition of Patient: Other dispositions Other disposition(s): Other (Comment) (awaiting tele-psych)  On Site Evaluation by:   Reviewed with Physician:     [facility] 08/16/2012 4:35 PM

## 2012-08-17 ENCOUNTER — Encounter (HOSPITAL_COMMUNITY): Payer: Self-pay | Admitting: *Deleted

## 2012-08-17 ENCOUNTER — Inpatient Hospital Stay (HOSPITAL_COMMUNITY)
Admission: AD | Admit: 2012-08-17 | Discharge: 2012-08-19 | DRG: 885 | Disposition: A | Discharge status: Home / Self Care | Payer: MEDICAID | Source: Intra-hospital | Attending: Psychiatry | Admitting: Psychiatry

## 2012-08-17 DIAGNOSIS — F329 Major depressive disorder, single episode, unspecified: Secondary | ICD-10-CM

## 2012-08-17 DIAGNOSIS — F32A Depression, unspecified: Secondary | ICD-10-CM

## 2012-08-17 DIAGNOSIS — F39 Unspecified mood [affective] disorder: Secondary | ICD-10-CM

## 2012-08-17 DIAGNOSIS — Z79899 Other long term (current) drug therapy: Secondary | ICD-10-CM

## 2012-08-17 DIAGNOSIS — F429 Obsessive-compulsive disorder, unspecified: Secondary | ICD-10-CM | POA: Diagnosis present

## 2012-08-17 DIAGNOSIS — F332 Major depressive disorder, recurrent severe without psychotic features: Principal | ICD-10-CM | POA: Diagnosis present

## 2012-08-17 DIAGNOSIS — R45851 Suicidal ideations: Secondary | ICD-10-CM

## 2012-08-17 LAB — URINE CULTURE: Colony Count: 50000

## 2012-08-17 MED ORDER — TRAZODONE HCL 50 MG PO TABS
50.0000 mg | ORAL_TABLET | Freq: Every evening | ORAL | Status: DC | PRN
  Administered 2012-08-18: 50 mg via ORAL
  Filled 2012-08-17: qty 1

## 2012-08-17 MED ORDER — ALUM & MAG HYDROXIDE-SIMETH 200-200-20 MG/5ML PO SUSP: 30.0000 mL | ORAL | Status: DC | PRN

## 2012-08-17 MED ORDER — MAGNESIUM HYDROXIDE 400 MG/5ML PO SUSP: 30.0000 mL | Freq: Every day | ORAL | Status: DC | PRN

## 2012-08-17 MED ORDER — NAPROXEN 500 MG PO TABS: 500.0000 mg | ORAL_TABLET | Freq: Two times a day (BID) | ORAL | Status: DC | PRN

## 2012-08-17 MED ORDER — DICYCLOMINE HCL 20 MG PO TABS: 20.0000 mg | ORAL_TABLET | Freq: Four times a day (QID) | ORAL | Status: DC | PRN

## 2012-08-17 MED ORDER — ONDANSETRON 4 MG PO TBDP
4.0000 mg | ORAL_TABLET | Freq: Four times a day (QID) | ORAL | Status: DC | PRN
  Administered 2012-08-18: 4 mg via ORAL

## 2012-08-17 MED ORDER — HYDROXYZINE HCL 25 MG PO TABS: 25.0000 mg | ORAL_TABLET | Freq: Four times a day (QID) | ORAL | Status: DC | PRN

## 2012-08-17 MED ORDER — METHOCARBAMOL 500 MG PO TABS: 500.0000 mg | ORAL_TABLET | Freq: Three times a day (TID) | ORAL | Status: DC | PRN

## 2012-08-17 MED ORDER — ACETAMINOPHEN 325 MG PO TABS
650.0000 mg | ORAL_TABLET | Freq: Four times a day (QID) | ORAL | Status: DC | PRN
  Administered 2012-08-17 – 2012-08-19 (×3): 650 mg via ORAL

## 2012-08-17 MED ORDER — LOPERAMIDE HCL 2 MG PO CAPS
2.0000 mg | ORAL_CAPSULE | ORAL | Status: DC | PRN
  Administered 2012-08-18: 4 mg via ORAL

## 2012-08-17 MED ORDER — SERTRALINE HCL 25 MG PO TABS
25.0000 mg | ORAL_TABLET | Freq: Every day | ORAL | Status: DC
  Administered 2012-08-17 – 2012-08-19 (×3): 25 mg via ORAL
  Filled 2012-08-17 (×6): qty 1

## 2012-08-17 NOTE — BHH Counselor (Signed)
Adult Comprehensive Assessment  Patient ID: Brandi Cross, female   DOB: [birth year], 42 y.o.   MRN: [id number]  Information Source: Information source: Patient  Current Stressors:  Educational / Learning stressors: None Employment / Job issues: None Family Relationships: 20 year old daughter has Cerebral Secretary/administrator / Lack of resources (include bankruptcy): Struggling financially Housing / Lack of housing: None Physical health (include injuries & life threatening diseases): None Social relationships: None Substance abuse: Patient reports abusing painkillers and Adderrall Bereavement / Loss: None  Living/Environment/Situation:  Living Arrangements: Other relatives Living conditions (as described by patient or guardian): Okay.  Patient has her three teenaged children in the home. How long has patient lived in current situation?: Seven years What is atmosphere in current home: Comfortable;Supportive  Family History:  Marital status: Divorced Divorced, when?: 14 years What types of issues is patient dealing with in the relationship?: Patient is in a relationship but reports no problems Does patient have children?: Yes How many children?: 3 How is patient's relationship with their children?: Good but patient is upset that she tells childeren that she wants to harm herself  Childhood History:  By whom was/is the patient raised?: Both parents Additional childhood history information: Patient reports having an okay childhood.  She states father was an alcoholic Description of patient's relationship with caregiver when they were a child: Good with mother Patient's description of current relationship with people who raised him/her: Good relationship with other.  Father is deceased Does patient have siblings?: Yes Number of Siblings: 2 Description of patient's current relationship with siblings: Okay with sister but distant with brother.   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.) Patient's job has been impacted by current illness: No What is the longest time patient has a held a job?: Three years 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 Has patient ever been in the [employer]?: No Has patient ever served in Buyer, retail?: No  Financial Resources:   Surveyor, quantity resources: No income Does patient have a Lawyer or guardian?: No  Alcohol/Substance Abuse:   What has been your use of drugs/alcohol within the last 12 months?: Patient reports abusing prescription drugs If attempted suicide, did drugs/alcohol play a role in this?: No Alcohol/Substance Abuse Treatment Hx: Denies past history Has alcohol/substance abuse ever caused legal problems?: No  Social Support System:   Forensic psychologist System: None Type of faith/religion: Ephriam Knuckles How does patient's faith help to cope with current illness?: Does not apply her faith  Leisure/Recreation:   Leisure and Hobbies: Spending time with children, going to movies  Strengths/Needs:   What things does the patient do well?: Good caretaker In what areas does patient struggle / problems for patient: Everyday life  Discharge Plan:   Does patient have access to transportation?: Yes Will patient be returning to same living situation after discharge?: Yes Currently receiving community mental health services: No If no, would patient like referral for services when  discharged?: Yes (What county?) [facility]) Does patient  have financial barriers related to discharge medications?: Yes Patient description of barriers related to discharge medications: Patient has Medicaid but very no income.  Summary/Recommendations:  Brandi Cross is a 42 year old Caucasian female admitted with Mood Disorder and Polysubstance abuse.  She will benefit from crisis stabilization, evaluation for medication, psycho-education groups for coping skills development, group therapy and case management for discharge planning.     Brandi Cross, Joesph July. 08/17/2012

## 2012-08-17 NOTE — Progress Notes (Signed)
Grief and Loss Group  Patients participated in a group discussing losses in their lives and grieving those losses. Patients explored ways in which past looses have contributed to present losses and ways in which loss impacts their daily lives and relationships with others.  Pt entered group 20 minutes prior to group ending.  Pt was attentive and supportive to group members.  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

## 2012-08-17 NOTE — Progress Notes (Signed)
Adult Psychoeducational Group Note  Date:  08/17/2012 Time:  3:43 PM  Group Topic/Focus:  Recovery Goals:   The focus of this group is to identify appropriate goals for recovery and establish a plan to achieve them.  Participation Level:  None  Participation Quality:  Resistant  Affect:  Defensive  Cognitive:  Appropriate  Insight: None  Engagement in Group:  Resistant  Modes of Intervention:  Discussion  Additional Comments:  Pt refused to interact while attending group. Pt remained to herself and appeared to be resistant to discussing anything about her recovery.   Sharyn Lull 08/17/2012, 3:43 PM

## 2012-08-17 NOTE — Progress Notes (Signed)
Pt has been in the bed most of the evening since shift change.  Pt was easily awoken when her name was called.  She says she is doing ok, but she is just tired.  She denies SI/HI/AV at this time.  Pt was encouraged to make her needs known to staff.  Pt voices no needs/concerns at this time.  Support/encouragement given.  Safety maintained with q15 minute checks.

## 2012-08-17 NOTE — Progress Notes (Signed)
D: Patient denies SI/HI and A/V hallucinations; patient reports sleep is poor; reports appetite to be poor ; reports energy level is low ; reports ability to pay attention is low; rates depression as 8/10; rates hopelessness 3/10; rates anxiety as 8/10;   A: Monitored q 15 minutes; patient encouraged to attend groups; patient educated about medications; patient given medications per physician orders; patient encouraged to express feelings and/or concerns  R: Patient alittle irritated about being here this morning and does not believe this place is for her; patient's interaction with staff and peers is appropriate; ; patient is taking medications as prescribed and tolerating medications; patient is attending all groups

## 2012-08-17 NOTE — BHH Suicide Risk Assessment (Signed)
Suicide Risk Assessment  Admission Assessment     Nursing information obtained from:  Patient Demographic factors:  Divorced or widowed;Caucasian;Unemployed Current Mental Status:  Self-harm thoughts Loss Factors:  NA Historical Factors:  Family history of mental illness or substance abuse Risk Reduction Factors:  Responsible for children under 42 years of age;Living with another person, especially a relative  CLINICAL FACTORS:   Depression:   Anhedonia Hopelessness Impulsivity Insomnia Severe  COGNITIVE FEATURES THAT CONTRIBUTE TO RISK:  Cognitively intact  SUICIDE RISK:   Mild:  Suicidal ideation of limited frequency, intensity, duration, and specificity.  There are no identifiable plans, no associated intent, mild dysphoria and related symptoms, good self-control (both objective and subjective assessment), few other risk factors, and identifiable protective factors, including available and accessible social support.  PLAN OF CARE: Start Zoloft to address depression. Provide education and supportive counselling. Discharge when safe and stable.  I certify that inpatient services furnished can reasonably be expected to improve the patient's condition.  Shirlean Berman 08/17/2012, 1:04 PM

## 2012-08-17 NOTE — Progress Notes (Signed)
[facility] LCSW Aftercare Discharge Planning Group Note  08/17/2012 11:26 AM  Participation Quality:  Appropriate and Attentive  Affect:  Appropriate, Depressed, Flat and Tearful  Cognitive:  Alert and Appropriate  Insight:  Engaged  Engagement in Group:  Engaged  Modes of Intervention:  Education, Exploration, Dentist, Rapport Building and Support  Summary of Progress/Problems:  Patient advised of being committed to the hospital by her sister due to making statements about self harm.  Patient currently denies SI/HI.  She rates depression at four/five, anxiety at eight, and hopelessness/ Helplessness at two.  Patient shared problems with daughter who has Cerebral Palsy, finances, and life are are stressors.  Patient has home and transportation.  She will need a referral for follow up.  Daily workbook provided.   Brandi Cross 08/17/2012, 11:26 AM

## 2012-08-17 NOTE — H&P (Signed)
Psychiatric Admission Assessment Adult  Patient Identification:  Brandi Cross Date of Evaluation:  08/17/2012 Chief Complaint:  depression History of Present Illness: Brandi Cross is an 42 y.o. female. The patient was brought to the ED on IVC paper work completed by the sister. The papers state that the patient did make a suicide gesture 2 weeks Ago. She was released after evaluation. The report is that she is stealing opiates from her disabled sister. She is also stealing Xanax from her grandmother. She is easily angered and throws things in the home. Her children live with the patients mother as she does not provide care for them. The patient denies SI and HI. She denies psychosis. She does not appear to be hallucinated nor delusional. She denies any substance abuse.         Today patient reports feeling tired from an early morning admission. She admits to increased depression recently and endorses several stressors. She reports having three teenage children with the oldest needing 24/7 nursing care due to cerebral palsy. Her other daughter recently told her that she is gay. 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 The patient admits to recent behavior changes including irritable mood including yelling at her children. Patient began making suicidal comments to her sister who became concerned. She has been struggling with depression for several months. Patient reports trying prozac in January but felt it worsened the symptoms. She is receptive to trying another antidepressant.   Associated Signs/Synptoms: Depression Symptoms:  depressed mood, insomnia, fatigue, hopelessness, recurrent thoughts of death, anxiety, insomnia, disturbed sleep, (Hypo) Manic Symptoms:  Denies Anxiety Symptoms:  Excessive Worry, Psychotic Symptoms:  Denies PTSD Symptoms: Denies  Psychiatric Specialty Exam: Physical Exam   Constitutional: She is oriented to person, place, and time. She appears well-developed and well-nourished.  HENT:  Head: Normocephalic and atraumatic.  Right Ear: External ear normal.  Left Ear: External ear normal.  Nose: Nose normal.  Mouth/Throat: Oropharynx is clear and moist.  Eyes: Conjunctivae and EOM are normal. Pupils are equal, round, and reactive to light.  Neck: Normal range of motion. Neck supple.  Cardiovascular: Normal rate, regular rhythm, normal heart sounds and intact distal pulses.   Respiratory: Effort normal and breath sounds normal.  GI: Soft. Bowel sounds are normal.  Musculoskeletal: Normal range of motion.  Neurological: She is alert and oriented to person, place, and time. She has normal reflexes.  Skin: Skin is warm and dry.    Review of Systems  Constitutional: Negative.   Eyes: Negative.   Respiratory: Negative.   Cardiovascular: Negative.   Gastrointestinal: Negative.   Genitourinary: Negative.   Musculoskeletal: Negative.   Skin: Negative.   Neurological: Positive for headaches.  Psychiatric/Behavioral: Positive for depression and substance abuse. Negative for suicidal ideas, hallucinations and memory loss. The patient is nervous/anxious and has insomnia.     Blood pressure 145/95, pulse 89, temperature 98.3 F (36.8 C), temperature source Oral, resp. rate 16, height 5' 2.5" (1.588 m), weight 69.854 kg (154 lb), last menstrual period 08/14/2012.Body mass index is 27.7 kg/(m^2).  General Appearance: Disheveled  Eye Solicitor::  Fair  Speech:  Clear and Coherent  Volume:  Decreased  Mood:  Depressed  Affect:  Flat  Thought Process:  Coherent  Orientation:  Full (Time, Place, and Person)  Thought Content:  WDL  Suicidal Thoughts:  No  Homicidal Thoughts:  No  Memory:  Immediate;  Good Recent;   Good Remote;   Good  Judgement:  Fair  Insight:  Fair  Psychomotor Activity:  Normal  Concentration:  Fair  Recall:  Fair  Akathisia:  No  Handed:   Right  AIMS (if indicated):     Assets:  Communication Skills Desire for Improvement Leisure Time Physical Health Resilience Social Support Talents/Skills  Sleep:  Number of Hours: 1.75    Past Psychiatric History: Diagnosis: Depression  Hospitalizations:Denies  Outpatient Care:None, sees a PCP  Substance Abuse Care:None  Self-Mutilation:None  Suicidal Attempts:None  Violent Behaviors:None   Past Medical History:   Past Medical History  Diagnosis Date  . Anxiety     pt denies  . Attention deficit     pt denies   None. Allergies:  No Known Allergies PTA Medications: No prescriptions prior to admission    Previous Psychotropic Medications:  Medication/Dose-Patient reports being started  Prozac 10 mg by PCP in Jan 2014 but felt it made "me more depressed."                  Substance Abuse History in the last 12 months:  no  Consequences of Substance Abuse: NA  Social History:  reports that she has never smoked. She does not have any smokeless tobacco history on file. She reports that she does not drink alcohol or use illicit drugs. Additional Social History:                      Current Place of Residence:   Place of Birth:   Family Members: Marital Status:  Divorced Children: 3  Sons:1  Daughters:2 Relationships: Education:  [employer] Problems/Performance: Religious Beliefs/Practices: History of Abuse (Emotional/Phsycial/Sexual) Teacher, music History:  None. Legal History: Hobbies/Interests:  Family History:  History reviewed. No pertinent family history.  Results for orders placed during the hospital encounter of 08/16/12 (from the past 72 hour(s))  URINALYSIS, ROUTINE W REFLEX MICROSCOPIC     Status: Abnormal   Collection Time    08/16/12  2:14 PM      Result Value Range   Color, Urine YELLOW  YELLOW   APPearance CLEAR  CLEAR   Specific Gravity, Urine 1.015  1.005 - 1.030   pH 6.5  5.0 -  8.0   Glucose, UA NEGATIVE  NEGATIVE mg/dL   Hgb urine dipstick LARGE (*) NEGATIVE   Bilirubin Urine NEGATIVE  NEGATIVE   Ketones, ur NEGATIVE  NEGATIVE mg/dL   Protein, ur NEGATIVE  NEGATIVE mg/dL   Urobilinogen, UA 0.2  0.0 - 1.0 mg/dL   Nitrite NEGATIVE  NEGATIVE   Leukocytes, UA SMALL (*) NEGATIVE  URINE MICROSCOPIC-ADD ON     Status: Abnormal   Collection Time    08/16/12  2:14 PM      Result Value Range   Squamous Epithelial / LPF MANY (*) RARE   WBC, UA 3-6  <3 WBC/hpf   RBC / HPF 7-10  <3 RBC/hpf   Bacteria, UA FEW (*) RARE  URINE RAPID DRUG SCREEN (HOSP PERFORMED)     Status: None   Collection Time    08/16/12  2:40 PM      Result Value Range   Opiates NONE DETECTED  NONE DETECTED   Cocaine NONE DETECTED  NONE DETECTED   Benzodiazepines NONE DETECTED  NONE DETECTED   Amphetamines NONE DETECTED  NONE DETECTED   Tetrahydrocannabinol NONE DETECTED  NONE DETECTED   Barbiturates NONE DETECTED  NONE DETECTED  Comment:            DRUG SCREEN FOR MEDICAL PURPOSES     ONLY.  IF CONFIRMATION IS NEEDED     FOR ANY PURPOSE, NOTIFY LAB     WITHIN 5 DAYS.                LOWEST DETECTABLE LIMITS     FOR URINE DRUG SCREEN     Drug Class       Cutoff (ng/mL)     Amphetamine      1000     Barbiturate      200     Benzodiazepine   200     Tricyclics       300     Opiates          300     Cocaine          300     THC              50  CBC WITH DIFFERENTIAL     Status: None   Collection Time    08/16/12  2:41 PM      Result Value Range   WBC 5.4  4.0 - 10.5 K/uL   RBC 4.58  3.87 - 5.11 MIL/uL   Hemoglobin 13.0  12.0 - 15.0 g/dL   HCT 21.3  08.6 - 57.8 %   MCV 87.6  78.0 - 100.0 fL   MCH 28.4  26.0 - 34.0 pg   MCHC 32.4  30.0 - 36.0 g/dL   RDW 46.9  62.9 - 52.8 %   Platelets 274  150 - 400 K/uL   Neutrophils Relative 72  43 - 77 %   Neutro Abs 3.9  1.7 - 7.7 K/uL   Lymphocytes Relative 23  12 - 46 %   Lymphs Abs 1.2  0.7 - 4.0 K/uL   Monocytes Relative 4  3 - 12 %    Monocytes Absolute 0.2  0.1 - 1.0 K/uL   Eosinophils Relative 0  0 - 5 %   Eosinophils Absolute 0.0  0.0 - 0.7 K/uL   Basophils Relative 1  0 - 1 %   Basophils Absolute 0.0  0.0 - 0.1 K/uL  COMPREHENSIVE METABOLIC PANEL     Status: Abnormal   Collection Time    08/16/12  2:41 PM      Result Value Range   Sodium 138  135 - 145 mEq/L   Potassium 3.9  3.5 - 5.1 mEq/L   Chloride 101  96 - 112 mEq/L   CO2 29  19 - 32 mEq/L   Glucose, Bld 110 (*) 70 - 99 mg/dL   BUN 8  6 - 23 mg/dL   Creatinine, Ser 4.13  0.50 - 1.10 mg/dL   Calcium 9.6  8.4 - 24.4 mg/dL   Total Protein 7.2  6.0 - 8.3 g/dL   Albumin 3.8  3.5 - 5.2 g/dL   AST 20  0 - 37 U/L   ALT 17  0 - 35 U/L   Alkaline Phosphatase 66  39 - 117 U/L   Total Bilirubin 0.3  0.3 - 1.2 mg/dL   GFR calc non Af Amer >90  >90 mL/min   GFR calc Af Amer >90  >90 mL/min   Comment:            The eGFR has been calculated     using the CKD EPI equation.     This calculation  has not been     validated in all clinical     situations.     eGFR's persistently     <90 mL/min signify     possible Chronic Kidney Disease.  ETHANOL     Status: None   Collection Time    08/16/12  2:41 PM      Result Value Range   Alcohol, Ethyl (B) <11  0 - 11 mg/dL   Comment:            LOWEST DETECTABLE LIMIT FOR     SERUM ALCOHOL IS 11 mg/dL     FOR MEDICAL PURPOSES ONLY  ACETAMINOPHEN LEVEL     Status: None   Collection Time    08/16/12  2:41 PM      Result Value Range   Acetaminophen (Tylenol), Serum <15.0  10 - 30 ug/mL   Comment:            THERAPEUTIC CONCENTRATIONS VARY     SIGNIFICANTLY. A RANGE OF 10-30     ug/mL MAY BE AN EFFECTIVE     CONCENTRATION FOR MANY PATIENTS.     HOWEVER, SOME ARE BEST TREATED     AT CONCENTRATIONS OUTSIDE THIS     RANGE.     ACETAMINOPHEN CONCENTRATIONS     >150 ug/mL AT 4 HOURS AFTER     INGESTION AND >50 ug/mL AT 12     HOURS AFTER INGESTION ARE     OFTEN ASSOCIATED WITH TOXIC     REACTIONS.  SALICYLATE LEVEL      Status: Abnormal   Collection Time    08/16/12  2:41 PM      Result Value Range   Salicylate Lvl <2.0 (*) 2.8 - 20.0 mg/dL   Psychological Evaluations:  Assessment:   AXIS I:  Mood Disorder NOS AXIS II:  Deferred AXIS III:   Past Medical History  Diagnosis Date  . Anxiety     pt denies  . Attention deficit     pt denies   AXIS IV:  other psychosocial or environmental problems and problems with primary support group AXIS V:  41-50 serious symptoms     Treatment Plan/Recommendations:   1. Admit for crisis management and stabilization. Estimated length of stay 5-7 days. 2. Medication management to reduce current symptoms to base line and improve the patient's level of functioning. Started on Zoloft 25 mg po daily for depressive and anxious symptoms. Trazodone 50 mg initiated to help improve sleep. 3. Develop treatment plan to decrease risk of relapse upon discharge of depressive symptoms and the need for readmission. 5. Group therapy to facilitate development of healthy coping skills to use for depression and anxiety. 6. Health care follow up as needed for medical problems.  7. Assist patient in developing coping skills for depressive symptoms.   8. Call for Consult with Hospitalist for additional specialty patient services as needed.   Treatment Plan Summary: Daily contact with patient to assess and evaluate symptoms and progress in treatment Medication management Current Medications:  Current Facility-Administered Medications  Medication Dose Route Frequency Provider Last Rate Last Dose  . acetaminophen (TYLENOL) tablet 650 mg  650 mg Oral Q6H PRN Kerry Hough, PA-C   650 mg at 08/17/12 0800  . alum & mag hydroxide-simeth (MAALOX/MYLANTA) 200-200-20 MG/5ML suspension 30 mL  30 mL Oral Q4H PRN Kerry Hough, PA-C      . dicyclomine (BENTYL) tablet 20 mg  20 mg Oral Q6H PRN Kerry Hough,  PA-C      . hydrOXYzine (ATARAX/VISTARIL) tablet 25 mg  25 mg Oral Q6H PRN  Kerry Hough, PA-C      . loperamide (IMODIUM) capsule 2-4 mg  2-4 mg Oral PRN Kerry Hough, PA-C      . magnesium hydroxide (MILK OF MAGNESIA) suspension 30 mL  30 mL Oral Daily PRN Kerry Hough, PA-C      . methocarbamol (ROBAXIN) tablet 500 mg  500 mg Oral Q8H PRN Kerry Hough, PA-C      . naproxen (NAPROSYN) tablet 500 mg  500 mg Oral BID PRN Kerry Hough, PA-C      . ondansetron (ZOFRAN-ODT) disintegrating tablet 4 mg  4 mg Oral Q6H PRN Kerry Hough, PA-C      . sertraline (ZOLOFT) tablet 25 mg  25 mg Oral Daily Laini Urick, MD   25 mg at 08/17/12 1521  . traZODone (DESYREL) tablet 50 mg  50 mg Oral QHS PRN Yehudis Monceaux, MD        Observation Level/Precautions:  15 minute checks  Laboratory:  CBC Chemistry Profile UDS-Results reviewed.   Psychotherapy: Individual and Group Therapy  Medications: Started on Zoloft  Consultations: None  Discharge Concerns: None currently  Estimated LOS: 5-7 days  Other:     I certify that inpatient services furnished can reasonably be expected to improve the patient's condition.   Fransisca Kaufmann ANN NP-C 3/25/20145:23 PM

## 2012-08-17 NOTE — Tx Team (Signed)
Initial Interdisciplinary Treatment Plan  PATIENT STRENGTHS: (choose at least two) Average or above average intelligence Capable of independent living Communication skills General fund of knowledge Supportive family/friends  PATIENT STRESSORS: Medication change or noncompliance   PROBLEM LIST: Problem List/Patient Goals Date to be addressed Date deferred Reason deferred Estimated date of resolution  Suicidal Ideations 08-17-12     Depression 08-17-12                                                DISCHARGE CRITERIA:  Improved stabilization in mood, thinking, and/or behavior Motivation to continue treatment in a less acute level of care Reduction of life-threatening or endangering symptoms to within safe limits Verbal commitment to aftercare and medication compliance  PRELIMINARY DISCHARGE PLAN: Outpatient therapy Return to previous living arrangement Return to previous work or school arrangements  PATIENT/FAMIILY INVOLVEMENT: This treatment plan has been presented to and reviewed with the patient, Brandi Cross, and/or family member.  The patient and family have been given the opportunity to ask questions and make suggestions.  [name] 08/17/2012, 3:48 AM

## 2012-08-17 NOTE — Progress Notes (Signed)
[facility] LCSW Group Therapy  Feelings Around Diagnosis 1:15 -  2:30 PM  08/17/2012 2:52 PM  Type of Therapy:  Group Therapy  Participation Level:  Did Not Attend  Participatio Wynn Banker 08/17/2012, 2:52 PM

## 2012-08-17 NOTE — Progress Notes (Signed)
Patient ID: Brandi Cross, female   DOB: 23-Jun-1970, 42 y.o.   MRN: [id number] Pt. Is 42 yo female being involuntarily committed.  Pt. Reports "my sister and mom had me admitted they seem to think I'm going to hurt myself" I've said things before but I'd never do anything." Pt. Had a recent SA last month and was seen at [facility] "but they didn't keep me." Pt. Seemed to be minimizing suicide attempts. Reports from ED is that pt. Has been taking the meds of her daughter and GM, threatening OD.  Pt. Is the mother of 3 children 19,18,17, the oldest child has cerebral palsy. Pt. Says daughter with cerebral palsy is very intelligent and engaged to be married. Pt. Reports she does not work quit her job couple of weeks ago. "I took a break." Pt. Has no significant medical hx. Pt. Is cooperative during admission. Pt. Offered food/drink, Pt. Oriented to hall/unit. Staff will monitor q42min for safety.

## 2012-08-18 DIAGNOSIS — F332 Major depressive disorder, recurrent severe without psychotic features: Principal | ICD-10-CM

## 2012-08-18 NOTE — Tx Team (Signed)
Interdisciplinary Treatment Plan Update   Date Reviewed:  08/18/2012  Time Reviewed:  9:52 AM  Progress in Treatment:   Attending groups: Yes Participating in groups: Yes Taking medication as prescribed: Yes  Tolerating medication: Yes Family/Significant other contact made: Patient to allow contact with family. Patient understands diagnosis: Yes  Discussing patient identified problems/goals with staff: Yes Medical problems stabilized or resolved: Yes Denies suicidal/homicidal ideation: Yes Patient has not harmed self or others: Yes  For review of initial/current patient goals, please see plan of care.  Estimated Length of Stay:    Reasons for Continued Hospitalization:  Anxiety Depression Medication stabilization  New Problems/Goals identified:    Discharge Plan or Barriers:   Home with outpatient follow up  Additional Comments:  Attendees:  Patient:   Brandi Cross 08/18/2012 9:52 AM   Signature: Patrick North, MD 08/18/2012 9:52 AM  Signature:Tina Arlana Pouch, RN 08/18/2012 9:52 AM  Signature: Harold Barban, RN 08/18/2012 9:52 AM  Signature: 08/18/2012 9:52 AM  Signature:   08/18/2012 9:52 AM  Signature:  Juline Patch, LCSW 08/18/2012 9:52 AM  Signature:  08/18/2012 9:52 AM  Signature: Liliane Bade, BSW 08/18/2012 9:52 AM  Signature: Fransisca Kaufmann, [facility] 08/18/2012 9:52 AM  Signature:    Signature:    Signature:      Scribe for Treatment Team:   Juline Patch,  08/18/2012 9:52 AM

## 2012-08-18 NOTE — Progress Notes (Signed)
[facility] MD Progress Note  08/18/2012 11:03 AM Brandi Cross  MRN:  [id number] Subjective:  Patient expressing sadness over her daughter`s cerebral palsy. Endorsing depressed mood, anxiety. Very tearful, states she feels guilty about her daughter.  Diagnosis:   Axis I: Major Depression, Recurrent severe Axis II: Deferred Axis III:  Past Medical History  Diagnosis Date  . Anxiety     pt denies  . Attention deficit     pt denies   Axis IV: other psychosocial or environmental problems Axis V: 41-50 serious symptoms  ADL's:  Intact  Sleep: poor  Appetite:  Poor   Psychiatric Specialty Exam: Review of Systems  Constitutional: Negative.   HENT: Negative.   Eyes: Negative.   Respiratory: Negative.   Cardiovascular: Negative.   Gastrointestinal: Positive for abdominal pain.  Genitourinary: Negative.   Musculoskeletal: Negative.   Skin: Negative.   Neurological: Negative.   Endo/Heme/Allergies: Negative.   Psychiatric/Behavioral: Positive for depression. The patient is nervous/anxious and has insomnia.     Blood pressure 142/93, pulse 74, temperature 98.3 F (36.8 C), temperature source Oral, resp. rate 18, height 5' 2.5" (1.588 m), weight 69.854 kg (154 lb), last menstrual period 08/14/2012.Body mass index is 27.7 kg/(m^2).  General Appearance: Casual  Eye Contact::  Fair  Speech:  Clear and Coherent  Volume:  Decreased  Mood:  Anxious, Depressed and Dysphoric  Affect:  Constricted and Depressed  Thought Process:  Coherent  Orientation:  Full (Time, Place, and Person)  Thought Content:  Rumination  Suicidal Thoughts:  No  Homicidal Thoughts:  No  Memory:  Immediate;   Fair Recent;   Fair Remote;   Fair  Judgement:  Fair  Insight:  Fair  Psychomotor Activity:  Decreased  Concentration:  Fair  Recall:  Fair  Akathisia:  No  Handed:  Right  AIMS (if indicated):     Assets:  Communication Skills Desire for Improvement Housing Social Support  Sleep:  Number of  Hours: 6.25   Current Medications: Current Facility-Administered Medications  Medication Dose Route Frequency Provider Last Rate Last Dose  . acetaminophen (TYLENOL) tablet 650 mg  650 mg Oral Q6H PRN Kerry Hough, PA-C   650 mg at 08/18/12 4098  . alum & mag hydroxide-simeth (MAALOX/MYLANTA) 200-200-20 MG/5ML suspension 30 mL  30 mL Oral Q4H PRN Kerry Hough, PA-C      . dicyclomine (BENTYL) tablet 20 mg  20 mg Oral Q6H PRN Kerry Hough, PA-C      . hydrOXYzine (ATARAX/VISTARIL) tablet 25 mg  25 mg Oral Q6H PRN Kerry Hough, PA-C      . loperamide (IMODIUM) capsule 2-4 mg  2-4 mg Oral PRN Kerry Hough, PA-C   4 mg at 08/18/12 1191  . magnesium hydroxide (MILK OF MAGNESIA) suspension 30 mL  30 mL Oral Daily PRN Kerry Hough, PA-C      . methocarbamol (ROBAXIN) tablet 500 mg  500 mg Oral Q8H PRN Kerry Hough, PA-C      . naproxen (NAPROSYN) tablet 500 mg  500 mg Oral BID PRN Kerry Hough, PA-C      . ondansetron (ZOFRAN-ODT) disintegrating tablet 4 mg  4 mg Oral Q6H PRN Kerry Hough, PA-C   4 mg at 08/18/12 4782  . sertraline (ZOLOFT) tablet 25 mg  25 mg Oral Daily Ellice Boultinghouse, MD   25 mg at 08/18/12 9562  . traZODone (DESYREL) tablet 50 mg  50 mg Oral QHS PRN Patrick North, MD  Lab Results:  Results for orders placed during the hospital encounter of 08/16/12 (from the past 48 hour(s))  URINALYSIS, ROUTINE W REFLEX MICROSCOPIC     Status: Abnormal   Collection Time    08/16/12  2:14 PM      Result Value Range   Color, Urine YELLOW  YELLOW   APPearance CLEAR  CLEAR   Specific Gravity, Urine 1.015  1.005 - 1.030   pH 6.5  5.0 - 8.0   Glucose, UA NEGATIVE  NEGATIVE mg/dL   Hgb urine dipstick LARGE (*) NEGATIVE   Bilirubin Urine NEGATIVE  NEGATIVE   Ketones, ur NEGATIVE  NEGATIVE mg/dL   Protein, ur NEGATIVE  NEGATIVE mg/dL   Urobilinogen, UA 0.2  0.0 - 1.0 mg/dL   Nitrite NEGATIVE  NEGATIVE   Leukocytes, UA SMALL (*) NEGATIVE  URINE MICROSCOPIC-ADD  ON     Status: Abnormal   Collection Time    08/16/12  2:14 PM      Result Value Range   Squamous Epithelial / LPF MANY (*) RARE   WBC, UA 3-6  <3 WBC/hpf   RBC / HPF 7-10  <3 RBC/hpf   Bacteria, UA FEW (*) RARE  URINE CULTURE     Status: None   Collection Time    08/16/12  2:14 PM      Result Value Range   Specimen Description URINE, CLEAN CATCH     Special Requests NONE     Culture  Setup Time 08/17/2012 01:51     Colony Count 50,000 COLONIES/ML     Culture       Value: Multiple bacterial morphotypes present, none predominant. Suggest appropriate recollection if clinically indicated.   Report Status 08/17/2012 FINAL    URINE RAPID DRUG SCREEN (HOSP PERFORMED)     Status: None   Collection Time    08/16/12  2:40 PM      Result Value Range   Opiates NONE DETECTED  NONE DETECTED   Cocaine NONE DETECTED  NONE DETECTED   Benzodiazepines NONE DETECTED  NONE DETECTED   Amphetamines NONE DETECTED  NONE DETECTED   Tetrahydrocannabinol NONE DETECTED  NONE DETECTED   Barbiturates NONE DETECTED  NONE DETECTED   Comment:            DRUG SCREEN FOR MEDICAL PURPOSES     ONLY.  IF CONFIRMATION IS NEEDED     FOR ANY PURPOSE, NOTIFY LAB     WITHIN 5 DAYS.                LOWEST DETECTABLE LIMITS     FOR URINE DRUG SCREEN     Drug Class       Cutoff (ng/mL)     Amphetamine      1000     Barbiturate      200     Benzodiazepine   200     Tricyclics       300     Opiates          300     Cocaine          300     THC              50  CBC WITH DIFFERENTIAL     Status: None   Collection Time    08/16/12  2:41 PM      Result Value Range   WBC 5.4  4.0 - 10.5 K/uL   RBC 4.58  3.87 - 5.11 MIL/uL  Hemoglobin 13.0  12.0 - 15.0 g/dL   HCT 14.7  82.9 - 56.2 %   MCV 87.6  78.0 - 100.0 fL   MCH 28.4  26.0 - 34.0 pg   MCHC 32.4  30.0 - 36.0 g/dL   RDW 13.0  86.5 - 78.4 %   Platelets 274  150 - 400 K/uL   Neutrophils Relative 72  43 - 77 %   Neutro Abs 3.9  1.7 - 7.7 K/uL   Lymphocytes  Relative 23  12 - 46 %   Lymphs Abs 1.2  0.7 - 4.0 K/uL   Monocytes Relative 4  3 - 12 %   Monocytes Absolute 0.2  0.1 - 1.0 K/uL   Eosinophils Relative 0  0 - 5 %   Eosinophils Absolute 0.0  0.0 - 0.7 K/uL   Basophils Relative 1  0 - 1 %   Basophils Absolute 0.0  0.0 - 0.1 K/uL  COMPREHENSIVE METABOLIC PANEL     Status: Abnormal   Collection Time    08/16/12  2:41 PM      Result Value Range   Sodium 138  135 - 145 mEq/L   Potassium 3.9  3.5 - 5.1 mEq/L   Chloride 101  96 - 112 mEq/L   CO2 29  19 - 32 mEq/L   Glucose, Bld 110 (*) 70 - 99 mg/dL   BUN 8  6 - 23 mg/dL   Creatinine, Ser 6.96  0.50 - 1.10 mg/dL   Calcium 9.6  8.4 - 29.5 mg/dL   Total Protein 7.2  6.0 - 8.3 g/dL   Albumin 3.8  3.5 - 5.2 g/dL   AST 20  0 - 37 U/L   ALT 17  0 - 35 U/L   Alkaline Phosphatase 66  39 - 117 U/L   Total Bilirubin 0.3  0.3 - 1.2 mg/dL   GFR calc non Af Amer >90  >90 mL/min   GFR calc Af Amer >90  >90 mL/min   Comment:            The eGFR has been calculated     using the CKD EPI equation.     This calculation has not been     validated in all clinical     situations.     eGFR's persistently     <90 mL/min signify     possible Chronic Kidney Disease.  ETHANOL     Status: None   Collection Time    08/16/12  2:41 PM      Result Value Range   Alcohol, Ethyl (B) <11  0 - 11 mg/dL   Comment:            LOWEST DETECTABLE LIMIT FOR     SERUM ALCOHOL IS 11 mg/dL     FOR MEDICAL PURPOSES ONLY  ACETAMINOPHEN LEVEL     Status: None   Collection Time    08/16/12  2:41 PM      Result Value Range   Acetaminophen (Tylenol), Serum <15.0  10 - 30 ug/mL   Comment:            THERAPEUTIC CONCENTRATIONS VARY     SIGNIFICANTLY. A RANGE OF 10-30     ug/mL MAY BE AN EFFECTIVE     CONCENTRATION FOR MANY PATIENTS.     HOWEVER, SOME ARE BEST TREATED     AT CONCENTRATIONS OUTSIDE THIS     RANGE.     ACETAMINOPHEN CONCENTRATIONS     >  150 ug/mL AT 4 HOURS AFTER     INGESTION AND >50 ug/mL AT 12      HOURS AFTER INGESTION ARE     OFTEN ASSOCIATED WITH TOXIC     REACTIONS.  SALICYLATE LEVEL     Status: Abnormal   Collection Time    08/16/12  2:41 PM      Result Value Range   Salicylate Lvl <2.0 (*) 2.8 - 20.0 mg/dL    Physical Findings: AIMS: Facial and Oral Movements Muscles of Facial Expression: None, normal Lips and Perioral Area: None, normal Jaw: None, normal Tongue: None, normal,Extremity Movements Upper (arms, wrists, hands, fingers): None, normal Lower (legs, knees, ankles, toes): None, normal, Trunk Movements Neck, shoulders, hips: None, normal, Overall Severity Severity of abnormal movements (highest score from questions above): None, normal Incapacitation due to abnormal movements: None, normal Patient's awareness of abnormal movements (rate only patient's report): No Awareness, Dental Status Current problems with teeth and/or dentures?: No Does patient usually wear dentures?: No  CIWA:  CIWA-Ar Total: 7 COWS:     Treatment Plan Summary: Daily contact with patient to assess and evaluate symptoms and progress in treatment Medication management  Plan: Continue current plan of care. Counselled patient about attending groups, learning coping skills to handle her emotions. Plan for discharge when stable.  Medical Decision Making Problem Points:  Established problem, stable/improving (1), Review of last therapy session (1) and Review of psycho-social stressors (1) Data Points:  Review of medication regiment & side effects (2) Review of new medications or change in dosage (2)  I certify that inpatient services furnished can reasonably be expected to improve the patient's condition.   Zackerie Sara 08/18/2012, 11:03 AM

## 2012-08-18 NOTE — Progress Notes (Signed)
Recreation Therapy Notes  Date: 03.26.2014 Time: 3:00pm Location: [facility]      Group Topic/Focus: Goal Setting  Participation Level: Active  Participation Quality: Appropriate  Affect: Euthymic  Cognitive: Appropriate  Additional Comments: Patient with peer play "Joined at the Hip" a game that required patients hold a beach ball between their hips and set a goal for how far they think they can walk without the ball dropping to the floor. Patient with peer set goal and reached goal. Patient with peer set goal for how far they could walk holding the beach ball with one finger. Patient with peer reached goal. Patient offered three suggestions to peer for reaching personal goal. Patient actively participated in group activity. Patient participated in discussion about importance of goals setting and how it can effect communication skills and self-esteem.   Marykay Lex Elizah Lydon, LRT/CTRS  Georganne Siple L 08/18/2012 4:08 PM

## 2012-08-18 NOTE — Progress Notes (Signed)
Patient ID: Brandi Cross, female   DOB: 10-Jul-1970, 42 y.o.   MRN: [id number] D: Patient in room on approach. Pt mood/affect depressed. Pt talked to Clinical research associate about her children  And how overprotective she is. Pt stated she reliezed she is doing too much for the kids and too little for herself which as contributed to her depression. Pt denies SI/HI and pain. Pt did not attend evening wrap up group. Cooperative with assessment. No acute distressed noted at this time.   A: Met with pt 1:1. Medications administered as prescribed. Writer encouraged pt to discuss feelings. Pt encouraged to come to staff with any question or concerns. 15 minutes checks for safety.  R: Patient remains safe. She is complaint with medications and group programming. Continue current POC.

## 2012-08-18 NOTE — Progress Notes (Signed)
Adult Psychoeducational Group Note  Date:  08/18/2012 Time:  11:48 AM  Group Topic/Focus:  Personal Choices and Values:   The focus of this group is to help patients assess and explore the importance of values in their lives, how their values affect their decisions, how they express their values and what opposes their expression.  Participation Level:  Active  Participation Quality:  Attentive  Affect:  Appropriate  Cognitive:  Appropriate  Insight: Good  Engagement in Group:  Engaged  Modes of Intervention:  Discussion  Additional Comments:  Pt  Stated she is only here because her family wanted her to come.  Darden Flemister T 08/18/2012, 11:48 AM

## 2012-08-18 NOTE — Progress Notes (Signed)
D: Patient appropriate and cooperative with staff and peers. Patient's affect/mood is blunted and depressed. She reported on the self inventory sheet that her sleep is fair, appetite is good, energy level is normal, and ability to pay attention is improving. Patient rated depression "2" and feelings of hopelessness "1". Attending groups.  A: Support and encouragement provided to patient. Administered scheduled medications per ordering MD. Monitor Q15 minute checks for safety.  R: Patient receptive. Denies SI/HI/AVH. Patient remains safe.

## 2012-08-18 NOTE — Progress Notes (Signed)
[facility] LCSW Aftercare Discharge Planning Group Note  08/18/2012 10:26 AM  Participation Quality:  Did not attend group.  Wynn Banker 08/18/2012, 10:26 AM
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
## 2012-08-18 NOTE — Progress Notes (Signed)
D: Pt in bed resting with eyes closed. Respirations even and unlabored. Pt appears to be in no signs of distress at this time. A: Q15min checks remains for this pt. R: Pt remains safe at this time.   

## 2012-08-19 DIAGNOSIS — F329 Major depressive disorder, single episode, unspecified: Secondary | ICD-10-CM

## 2012-08-19 DIAGNOSIS — F3289 Other specified depressive episodes: Secondary | ICD-10-CM

## 2012-08-19 MED ORDER — TRAZODONE HCL 50 MG PO TABS: 50.0000 mg | ORAL_TABLET | Freq: Every evening | ORAL | Status: DC | PRN

## 2012-08-19 MED ORDER — HYDROXYZINE HCL 25 MG PO TABS: 25.0000 mg | ORAL_TABLET | Freq: Four times a day (QID) | ORAL | Status: DC | PRN

## 2012-08-19 MED ORDER — SERTRALINE HCL 25 MG PO TABS: 25.0000 mg | ORAL_TABLET | Freq: Every day | ORAL | Status: DC
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
## 2012-08-19 NOTE — Progress Notes (Signed)
Discharge Note  D: Patient affect brighter today, as opposed to previous shift; her mood was appropriate to the circumstance. Patient verbalized that she was ready to get home and see her kids. She reported on the self inventory sheet that her appetite is good, energy level is normal, and ability to pay attention is good. Patient rated depression "2" and feelings of hopelessness "0".  A: Support and encouragement provided to patient. Administered scheduled medications per MD orders. Discharge instructions/prescriptions given to patient. Returned belongings to patient.  R: Patient receptive. Denies SI/HI/AVH. Patient d/c without incident. Patient verbalized understanding of discharge instructions and prescriptions.

## 2012-08-19 NOTE — Progress Notes (Signed)
The focus of this group is to help patients review their daily goal of treatment and discuss progress on daily workbooks. Patient did not attend group; patient remained in his room during group time.

## 2012-08-19 NOTE — Discharge Summary (Signed)
Physician Discharge Summary Note  Patient:  Brandi Cross is an 42 y.o., female MRN:  [id number] DOB:  06/29/1970 Patient phone:  [phone] (home)  Patient address:   [address] [place] [address],   Date of Admission:  08/17/2012 Date of Discharge: 08/19/12  Reason for Admission:  Depression with SI  Discharge Diagnoses: Principal Problem:   Depression  Review of Systems  Constitutional: Negative.   HENT: Negative.   Eyes: Negative.   Respiratory: Negative.   Cardiovascular: Negative.   Gastrointestinal: Negative.   Genitourinary: Negative.   Musculoskeletal: Negative.   Skin: Negative.   Neurological: Negative.   Endo/Heme/Allergies: Negative.   Psychiatric/Behavioral: Positive for depression. Negative for suicidal ideas, hallucinations, memory loss and substance abuse. The patient is nervous/anxious. The patient does not have insomnia.    Axis Diagnosis:   AXIS I:  Major Depression, Recurrent severe and Obsessive Compulsive Disorder AXIS II:  Deferred AXIS III:   Past Medical History  Diagnosis Date  . Anxiety     pt denies  . Attention deficit     pt denies   AXIS IV:  other psychosocial or environmental problems and problems with primary support group AXIS V:  61-70 mild symptoms  Level of Care:  OP  Hospital Course:  Brandi Cross is an 42 y.o. female. The patient was brought to the ED on IVC paper work completed by the sister. The papers state that the patient did make a suicide gesture 2 weeks Ago. She was released after evaluation. The report is that she is stealing opiates from her disabled sister. She is also stealing Xanax from her grandmother. She is easily angered and throws things in the home. Her children live with the patients mother as she does not provide care for them. The patient denies SI and HI. She denies psychosis. She does not appear to be hallucinated nor delusional. She denies any substance abuse.   Hospital Course:      The  duration of stay was two days.      The patient was seen and evaluated by the Treatment team consisting of Psychiatrist, NP-C, RN, Case Manager, and Therapist for evaluation and treatment plan with goal of stabilization upon discharge. The patient's physical and mental health problems were identified and treated appropriately.      Multiple modalities of treatment were used including medication, individual and group therapies, unit programming, improved nutrition, physical activity, and family sessions as needed. Patient was started on Zoloft 25 mg daily for depressive symptoms, Trazodone 50 mg at bedtime prn insomnia. The patient had vistaril 25 mg available every six hours as needed for anxiety and received a prescription at discharge. Dynver was focused on getting back to take care her children. Patient was assisted to also put her own needs first so as not to burn out with her responsibilities.      The symptoms of Depression were monitored daily by evaluation by clinical provider.  The patient's mental and emotional status was evaluated by a daily self inventory completed by the patient.      Improvement was demonstrated by declining numbers on the self assessment, improving vital signs, increased cognition, and improvement in mood, sleep, appetite as well as a reduction in physical symptoms.       The patient was evaluated and found to be stable enough for discharge and was released home per the initial plan of treatment.   Mental Status Exam:  For mental status exam please see mental status exam  and  suicide risk assessment completed by attending physician prior to discharge.      Consults:  None  Significant Diagnostic Studies:  labs: CBC, chem profile, UDS  Discharge Vitals:   Blood pressure 116/78, pulse 77, temperature 98.5 F (36.9 C), temperature source Oral, resp. rate 18, height 5' 2.5" (1.588 m), weight 69.854 kg (154 lb), last menstrual period 08/14/2012. Body mass index is  27.7 kg/(m^2). Lab Results:   Results for orders placed during the hospital encounter of 08/16/12 (from the past 72 hour(s))  URINALYSIS, ROUTINE W REFLEX MICROSCOPIC     Status: Abnormal   Collection Time    08/16/12  2:14 PM      Result Value Range   Color, Urine YELLOW  YELLOW   APPearance CLEAR  CLEAR   Specific Gravity, Urine 1.015  1.005 - 1.030   pH 6.5  5.0 - 8.0   Glucose, UA NEGATIVE  NEGATIVE mg/dL   Hgb urine dipstick LARGE (*) NEGATIVE   Bilirubin Urine NEGATIVE  NEGATIVE   Ketones, ur NEGATIVE  NEGATIVE mg/dL   Protein, ur NEGATIVE  NEGATIVE mg/dL   Urobilinogen, UA 0.2  0.0 - 1.0 mg/dL   Nitrite NEGATIVE  NEGATIVE   Leukocytes, UA SMALL (*) NEGATIVE  URINE MICROSCOPIC-ADD ON     Status: Abnormal   Collection Time    08/16/12  2:14 PM      Result Value Range   Squamous Epithelial / LPF MANY (*) RARE   WBC, UA 3-6  <3 WBC/hpf   RBC / HPF 7-10  <3 RBC/hpf   Bacteria, UA FEW (*) RARE  URINE CULTURE     Status: None   Collection Time    08/16/12  2:14 PM      Result Value Range   Specimen Description URINE, CLEAN CATCH     Special Requests NONE     Culture  Setup Time 08/17/2012 01:51     Colony Count 50,000 COLONIES/ML     Culture       Value: Multiple bacterial morphotypes present, none predominant. Suggest appropriate recollection if clinically indicated.   Report Status 08/17/2012 FINAL    URINE RAPID DRUG SCREEN (HOSP PERFORMED)     Status: None   Collection Time    08/16/12  2:40 PM      Result Value Range   Opiates NONE DETECTED  NONE DETECTED   Cocaine NONE DETECTED  NONE DETECTED   Benzodiazepines NONE DETECTED  NONE DETECTED   Amphetamines NONE DETECTED  NONE DETECTED   Tetrahydrocannabinol NONE DETECTED  NONE DETECTED   Barbiturates NONE DETECTED  NONE DETECTED   Comment:            DRUG SCREEN FOR MEDICAL PURPOSES     ONLY.  IF CONFIRMATION IS NEEDED     FOR ANY PURPOSE, NOTIFY LAB     WITHIN 5 DAYS.                LOWEST DETECTABLE LIMITS      FOR URINE DRUG SCREEN     Drug Class       Cutoff (ng/mL)     Amphetamine      1000     Barbiturate      200     Benzodiazepine   200     Tricyclics       300     Opiates          300     Cocaine  300     THC              50  CBC WITH DIFFERENTIAL     Status: None   Collection Time    08/16/12  2:41 PM      Result Value Range   WBC 5.4  4.0 - 10.5 K/uL   RBC 4.58  3.87 - 5.11 MIL/uL   Hemoglobin 13.0  12.0 - 15.0 g/dL   HCT 45.4  09.8 - 11.9 %   MCV 87.6  78.0 - 100.0 fL   MCH 28.4  26.0 - 34.0 pg   MCHC 32.4  30.0 - 36.0 g/dL   RDW 14.7  82.9 - 56.2 %   Platelets 274  150 - 400 K/uL   Neutrophils Relative 72  43 - 77 %   Neutro Abs 3.9  1.7 - 7.7 K/uL   Lymphocytes Relative 23  12 - 46 %   Lymphs Abs 1.2  0.7 - 4.0 K/uL   Monocytes Relative 4  3 - 12 %   Monocytes Absolute 0.2  0.1 - 1.0 K/uL   Eosinophils Relative 0  0 - 5 %   Eosinophils Absolute 0.0  0.0 - 0.7 K/uL   Basophils Relative 1  0 - 1 %   Basophils Absolute 0.0  0.0 - 0.1 K/uL  COMPREHENSIVE METABOLIC PANEL     Status: Abnormal   Collection Time    08/16/12  2:41 PM      Result Value Range   Sodium 138  135 - 145 mEq/L   Potassium 3.9  3.5 - 5.1 mEq/L   Chloride 101  96 - 112 mEq/L   CO2 29  19 - 32 mEq/L   Glucose, Bld 110 (*) 70 - 99 mg/dL   BUN 8  6 - 23 mg/dL   Creatinine, Ser 1.30  0.50 - 1.10 mg/dL   Calcium 9.6  8.4 - 86.5 mg/dL   Total Protein 7.2  6.0 - 8.3 g/dL   Albumin 3.8  3.5 - 5.2 g/dL   AST 20  0 - 37 U/L   ALT 17  0 - 35 U/L   Alkaline Phosphatase 66  39 - 117 U/L   Total Bilirubin 0.3  0.3 - 1.2 mg/dL   GFR calc non Af Amer >90  >90 mL/min   GFR calc Af Amer >90  >90 mL/min   Comment:            The eGFR has been calculated     using the CKD EPI equation.     This calculation has not been     validated in all clinical     situations.     eGFR's persistently     <90 mL/min signify     possible Chronic Kidney Disease.  ETHANOL     Status: None   Collection Time     08/16/12  2:41 PM      Result Value Range   Alcohol, Ethyl (B) <11  0 - 11 mg/dL   Comment:            LOWEST DETECTABLE LIMIT FOR     SERUM ALCOHOL IS 11 mg/dL     FOR MEDICAL PURPOSES ONLY  ACETAMINOPHEN LEVEL     Status: None   Collection Time    08/16/12  2:41 PM      Result Value Range   Acetaminophen (Tylenol), Serum <15.0  10 - 30 ug/mL   Comment:  THERAPEUTIC CONCENTRATIONS VARY     SIGNIFICANTLY. A RANGE OF 10-30     ug/mL MAY BE AN EFFECTIVE     CONCENTRATION FOR MANY PATIENTS.     HOWEVER, SOME ARE BEST TREATED     AT CONCENTRATIONS OUTSIDE THIS     RANGE.     ACETAMINOPHEN CONCENTRATIONS     >150 ug/mL AT 4 HOURS AFTER     INGESTION AND >50 ug/mL AT 12     HOURS AFTER INGESTION ARE     OFTEN ASSOCIATED WITH TOXIC     REACTIONS.  SALICYLATE LEVEL     Status: Abnormal   Collection Time    08/16/12  2:41 PM      Result Value Range   Salicylate Lvl <2.0 (*) 2.8 - 20.0 mg/dL    Physical Findings: AIMS: Facial and Oral Movements Muscles of Facial Expression: None, normal Lips and Perioral Area: None, normal Jaw: None, normal Tongue: None, normal,Extremity Movements Upper (arms, wrists, hands, fingers): None, normal Lower (legs, knees, ankles, toes): None, normal, Trunk Movements Neck, shoulders, hips: None, normal, Overall Severity Severity of abnormal movements (highest score from questions above): None, normal Incapacitation due to abnormal movements: None, normal Patient's awareness of abnormal movements (rate only patient's report): No Awareness, Dental Status Current problems with teeth and/or dentures?: No Does patient usually wear dentures?: No  CIWA:  CIWA-Ar Total: 3 COWS:     Psychiatric Specialty Exam: See Psychiatric Specialty Exam and Suicide Risk Assessment completed by Attending Physician prior to discharge.  Discharge destination:  Home  Is patient on multiple antipsychotic therapies at discharge:  No   Has Patient had three  or more failed trials of antipsychotic monotherapy by history:  No  Recommended Plan for Multiple Antipsychotic Therapies: N/A  Discharge Orders   Future Orders Complete By Expires     Activity as tolerated - No restrictions  As directed         Medication List    TAKE these medications     Indication   hydrOXYzine 25 MG tablet  Commonly known as:  ATARAX/VISTARIL  Take 1 tablet (25 mg total) by mouth every 6 (six) hours as needed for anxiety.   Indication:  Tension     sertraline 25 MG tablet  Commonly known as:  ZOLOFT  Take 1 tablet (25 mg total) by mouth daily.   Indication:  Anxiety Disorder     traZODone 50 MG tablet  Commonly known as:  DESYREL  Take 1 tablet (50 mg total) by mouth at bedtime as needed for sleep.   Indication:  Major Depressive Disorder         Follow-up recommendations:  Activity:  As tolerated Diet:  Heart healthy Tests:  None Other:  Stable  Comments:  Patient instructed to follow up as scheduled.   Total Discharge Time:  Greater than 30 minutes.  SignedFransisca Kaufmann ANN NP-C 08/19/2012, 10:41 AM

## 2012-08-19 NOTE — Progress Notes (Signed)
[facility] INPATIENT:  Family/Significant Other Suicide Prevention Education  Suicide Prevention Education:  Education Completed; London Pepper, Mother, [phone]; has been identified by the patient as the family member/significant other with whom the patient will be residing, and identified as the person(s) who will aid the patient in the event of a mental health crisis (suicidal ideations/suicide attempt).  With written consent from the patient, the family member/significant other has been provided the following suicide prevention education, prior to the and/or following the discharge of the patient.  The suicide prevention education provided includes the following:  Suicide risk factors  Suicide prevention and interventions  National Suicide Hotline telephone number  [facility] assessment telephone number  [facility] Emergency Assistance 911  [facility] and/or Residential Mobile Crisis Unit telephone number  Request made of family/significant other to:  Remove weapons (e.g., guns, rifles, knives), all items previously/currently identified as safety concern.  Mother reports patient does not have guns.  Remove drugs/medications (over-the-counter, prescriptions, illicit drugs), all items previously/currently identified as a safety concern.  The family member/significant other verbalizes understanding of the suicide prevention education information provided.  The family member/significant other agrees to remove the items of safety concern listed above.  Wynn Banker 08/19/2012, 1:05 PM

## 2012-08-19 NOTE — [facility] Suicide Risk Assessment (Signed)
Suicide Risk Assessment  Discharge Assessment     Demographic Factors:  Female, caucasian  Mental Status Per Nursing Assessment::   On Admission:  Self-harm thoughts  Current Mental Status by Physician: Alert and oriented to 4. Denies AH/VH/SI/HI  Loss Factors: Financial problems/change in socioeconomic status  Historical Factors: Family history of mental illness or substance abuse and Impulsivity  Risk Reduction Factors:   Responsible for children under 25 years of age, Living with another person, especially a relative, Positive social support and Positive coping skills or problem solving skills  Continued Clinical Symptoms:  Depression:   Recent sense of peace/wellbeing  Cognitive Features That Contribute To Risk:  Cognitively intact  Suicide Risk:  Minimal: No identifiable suicidal ideation.  Patients presenting with no risk factors but with morbid ruminations; may be classified as minimal risk based on the severity of the depressive symptoms  Discharge Diagnoses:   AXIS I:  Major Depression, Recurrent severe and Obsessive Compulsive Disorder AXIS II:  Deferred AXIS III:   Past Medical History  Diagnosis Date  . Anxiety     pt denies  . Attention deficit     pt denies   AXIS IV:  other psychosocial or environmental problems AXIS V:  61-70 mild symptoms  Plan Of Care/Follow-up recommendations:  Activity:  As tolerated Diet:  regular Follow up with outpatient appointments.  Is patient on multiple antipsychotic therapies at discharge:  No   Has Patient had three or more failed trials of antipsychotic monotherapy by history:  No  Recommended Plan for Multiple Antipsychotic Therapies: NA  Kelsa Jaworowski 08/19/2012, 10:51 AM

## 2013-08-31 ENCOUNTER — Emergency Department (HOSPITAL_COMMUNITY)
Admission: EM | Admit: 2013-08-31 | Discharge: 2013-09-01 | Disposition: A | Discharge status: Home / Self Care | Payer: Medicaid Other | Attending: Emergency Medicine | Admitting: Emergency Medicine

## 2013-08-31 ENCOUNTER — Encounter (HOSPITAL_COMMUNITY): Payer: Self-pay | Admitting: Emergency Medicine

## 2013-08-31 DIAGNOSIS — N898 Other specified noninflammatory disorders of vagina: Secondary | ICD-10-CM | POA: Insufficient documentation

## 2013-08-31 DIAGNOSIS — R1031 Right lower quadrant pain: Secondary | ICD-10-CM | POA: Insufficient documentation

## 2013-08-31 DIAGNOSIS — F411 Generalized anxiety disorder: Secondary | ICD-10-CM | POA: Insufficient documentation

## 2013-08-31 DIAGNOSIS — N926 Irregular menstruation, unspecified: Secondary | ICD-10-CM | POA: Insufficient documentation

## 2013-08-31 DIAGNOSIS — Z3202 Encounter for pregnancy test, result negative: Secondary | ICD-10-CM | POA: Insufficient documentation

## 2013-08-31 DIAGNOSIS — Z79899 Other long term (current) drug therapy: Secondary | ICD-10-CM | POA: Insufficient documentation

## 2013-08-31 DIAGNOSIS — R109 Unspecified abdominal pain: Secondary | ICD-10-CM

## 2013-08-31 DIAGNOSIS — N939 Abnormal uterine and vaginal bleeding, unspecified: Secondary | ICD-10-CM

## 2013-08-31 NOTE — ED Notes (Signed)
PT C/O BACK PAIN THAT RADIATES AROUND TO HER RLQ. PT STATES SHE IS BLEEDING BLOOD CLOTS AND JUST HASN'T GOTTEN AROUND TO CALLING HER DR. PT STATES CRAMPING STARTED 2 DAYS AGO AND BLEEDING HAS BEEN GOING ON SINCE April 1ST.

## 2013-09-01 ENCOUNTER — Emergency Department (HOSPITAL_COMMUNITY): Payer: Medicaid Other

## 2013-09-01 ENCOUNTER — Encounter (HOSPITAL_COMMUNITY): Payer: Self-pay | Admitting: Radiology

## 2013-09-01 LAB — BASIC METABOLIC PANEL
BUN: 11 mg/dL (ref 6–23)
CHLORIDE: 101 meq/L (ref 96–112)
CO2: 29 mEq/L (ref 19–32)
Calcium: 9.6 mg/dL (ref 8.4–10.5)
Creatinine, Ser: 0.7 mg/dL (ref 0.50–1.10)
Glucose, Bld: 102 mg/dL — ABNORMAL HIGH (ref 70–99)
POTASSIUM: 3.7 meq/L (ref 3.7–5.3)
SODIUM: 141 meq/L (ref 137–147)

## 2013-09-01 LAB — URINALYSIS, ROUTINE W REFLEX MICROSCOPIC
Bilirubin Urine: NEGATIVE
GLUCOSE, UA: NEGATIVE mg/dL
KETONES UR: NEGATIVE mg/dL
Nitrite: NEGATIVE
PH: 6 (ref 5.0–8.0)
Protein, ur: NEGATIVE mg/dL
Specific Gravity, Urine: >1.03 — ABNORMAL HIGH (ref 1.005–1.030)
Urobilinogen, UA: 0.2 mg/dL (ref 0.0–1.0)

## 2013-09-01 LAB — CBC WITH DIFFERENTIAL/PLATELET
BASOS PCT: 1 % (ref 0–1)
Basophils Absolute: 0.1 10*3/uL (ref 0.0–0.1)
Eosinophils Absolute: 0.1 10*3/uL (ref 0.0–0.7)
Eosinophils Relative: 1 % (ref 0–5)
HEMATOCRIT: 36.6 % (ref 36.0–46.0)
HEMOGLOBIN: 11.7 g/dL — AB (ref 12.0–15.0)
LYMPHS ABS: 2.6 10*3/uL (ref 0.7–4.0)
LYMPHS PCT: 35 % (ref 12–46)
MCH: 28.3 pg (ref 26.0–34.0)
MCHC: 32 g/dL (ref 30.0–36.0)
MCV: 88.4 fL (ref 78.0–100.0)
MONO ABS: 0.4 10*3/uL (ref 0.1–1.0)
MONOS PCT: 5 % (ref 3–12)
NEUTROS ABS: 4.2 10*3/uL (ref 1.7–7.7)
NEUTROS PCT: 58 % (ref 43–77)
Platelets: 255 10*3/uL (ref 150–400)
RBC: 4.14 MIL/uL (ref 3.87–5.11)
RDW: 13.6 % (ref 11.5–15.5)
WBC: 7.3 10*3/uL (ref 4.0–10.5)

## 2013-09-01 LAB — HEPATIC FUNCTION PANEL
ALBUMIN: 4 g/dL (ref 3.5–5.2)
ALK PHOS: 65 U/L (ref 39–117)
ALT: 25 U/L (ref 0–35)
AST: 26 U/L (ref 0–37)
BILIRUBIN TOTAL: 0.2 mg/dL — AB (ref 0.3–1.2)
Bilirubin, Direct: <0.2 mg/dL (ref 0.0–0.3)
Total Protein: 7.6 g/dL (ref 6.0–8.3)

## 2013-09-01 LAB — URINE MICROSCOPIC-ADD ON

## 2013-09-01 LAB — SAMPLE TO BLOOD BANK

## 2013-09-01 LAB — PREGNANCY, URINE: Preg Test, Ur: NEGATIVE

## 2013-09-01 MED ORDER — IBUPROFEN 800 MG PO TABS
800.0000 mg | ORAL_TABLET | Freq: Once | ORAL | Status: AC
  Administered 2013-09-01: 800 mg via ORAL
  Filled 2013-09-01: qty 1

## 2013-09-01 MED ORDER — IBUPROFEN 800 MG PO TABS: 800.0000 mg | ORAL_TABLET | Freq: Three times a day (TID) | ORAL | Status: DC

## 2013-09-01 MED ORDER — HYDROCODONE-ACETAMINOPHEN 5-325 MG PO TABS: 1.0000 | ORAL_TABLET | ORAL | Status: DC | PRN

## 2013-09-01 MED ORDER — SODIUM CHLORIDE 0.9 % IV BOLUS (SEPSIS)
500.0000 mL | Freq: Once | INTRAVENOUS | Status: AC
Start: 2013-09-01 — End: 2013-09-01
  Administered 2013-09-01: 500 mL via INTRAVENOUS

## 2013-09-01 MED ORDER — HYDROCODONE-ACETAMINOPHEN 5-325 MG PO TABS
1.0000 | ORAL_TABLET | Freq: Once | ORAL | Status: AC
Start: 2013-09-01 — End: 2013-09-01
  Administered 2013-09-01: 1 via ORAL
  Filled 2013-09-01: qty 1

## 2013-09-01 NOTE — Discharge Instructions (Signed)
Your hemoglobin and hematocrit are stable. There no orthostatic blood pressure or pulse changes indicating low volume. The CT scan of your abdomen is negative for signs of appendicitis, kidney stone, or tumor defect. Please see your GYN physician as sone as possible for evaluation and management of this vaginal bleeding. Please increase fluids. May use ibuprofen 3 times daily for mild discomfort, may use Norco for more severe discomfort. Please see your primary doctor, or return to the emergency department if any lightheadedness, loss of consciousness, severe increase in vaginal bleeding. Abnormal Uterine Bleeding Abnormal uterine bleeding means bleeding from the vagina that is not your normal menstrual period. This can be:  Bleeding or spotting between periods.  Bleeding after sex (sexual intercourse).  Bleeding that is heavier or more than normal.  Periods that last longer than usual.  Bleeding after menopause. There are many problems that may cause this. Treatment will depend on the cause of the bleeding. Any kind of bleeding that is not normal should be reviewed by your doctor.  HOME CARE Watch your condition for any changes. These actions may lessen any discomfort you are having:  Do not use tampons or douches as told by your doctor.  Change your pads often. You should get regular pelvic exams and Pap tests. Keep all appointments for tests as told by your doctor. GET HELP IF:  You are bleeding for more than 1 week.  You feel dizzy at times. GET HELP RIGHT AWAY IF:   You pass out.  You have to change pads every 15 to 30 minutes.  You have belly pain.  You have a fever.  You become sweaty or weak.  You are passing large blood clots from the vagina.  You feel sick to your stomach (nauseous) and throw up (vomit). MAKE SURE YOU:  Understand these instructions.  Will watch your condition.  Will get help right away if you are not doing well or get worse. Document  Released: 03/09/2009 Document Revised: 03/02/2013 Document Reviewed: 12/09/2012 [facility] Patient Information 2014 [employer], [place].

## 2013-09-01 NOTE — ED Provider Notes (Signed)
Medical screening examination/treatment/procedure(s) were performed by non-physician practitioner and as supervising physician I was immediately available for consultation/collaboration.   Olen Eaves, MD 09/01/13 0656 

## 2013-09-01 NOTE — ED Provider Notes (Signed)
CSN: [id number]     Arrival date & time 08/31/13  2036 History   First MD Initiated Contact with Patient 08/31/13 2340     Chief Complaint  Patient presents with  . Abdominal Pain     (Consider location/radiation/quality/duration/timing/severity/associated sxs/prior Treatment) HPI Comments: Patient is a 43 year old female who presents to the emergency department with abdominal pain. The patient states that for the past 2-3 months she has been having some abdominal pain, and some changes in her menstrual flow. In the last 3 days she has had severe pain mostly on the right side. She states that she has had vaginal bleeding passing some clots since April 1. Her last regular cycle was March 20. She now feels that the pain has moved from her lower abdomen and is extending into her right leg. The patient describes the stomach pain as a cramping type pain at times sharp. She's not had any temperature elevations. She's not been injured in the right lower area or the right flank area.                                                     She's not had any abdominal operations. She states she has vomited x2 yesterday. She is currently not on any birth control pills or other methods. She also complains of some back pain. She presents to the emergency department for evaluation of these problems.      Patient is a 43 y.o. female presenting with abdominal pain. The history is provided by the patient.  Abdominal Pain Associated symptoms: vaginal bleeding   Associated symptoms: no chest pain, no cough, no dysuria, no hematuria and no shortness of breath     Past Medical History  Diagnosis Date  . Anxiety     pt denies   History reviewed. No pertinent past surgical history. History reviewed. No pertinent family history. History  Substance Use Topics  . Smoking status: Never Smoker   . Smokeless tobacco: Not on file  . Alcohol Use: No   OB History   Grav Para Term Preterm Abortions TAB SAB Ect Mult Living                  Review of Systems  Constitutional: Negative for activity change.       All ROS Neg except as noted in HPI  HENT: Negative for nosebleeds.   Eyes: Negative for photophobia and discharge.  Respiratory: Negative for cough, shortness of breath and wheezing.   Cardiovascular: Negative for chest pain and palpitations.  Gastrointestinal: Positive for abdominal pain. Negative for blood in stool.  Genitourinary: Positive for flank pain, vaginal bleeding and menstrual problem. Negative for dysuria, frequency and hematuria.  Musculoskeletal: Negative for arthralgias, back pain and neck pain.  Skin: Negative.   Neurological: Negative for dizziness, seizures and speech difficulty.  Psychiatric/Behavioral: Negative for hallucinations and confusion.      Allergies  Review of patient's allergies indicates no known allergies.  Home Medications   Current Outpatient Rx  Name  Route  Sig  Dispense  Refill  . hydrOXYzine (ATARAX/VISTARIL) 25 MG tablet   Oral   Take 1 tablet (25 mg total) by mouth every 6 (six) hours as needed for anxiety.   30 tablet   0     For anxious episodes.   . sertraline (ZOLOFT)  25 MG tablet   Oral   Take 1 tablet (25 mg total) by mouth daily.   30 tablet   0     For Depression/Anxiety   . traZODone (DESYREL) 50 MG tablet   Oral   Take 1 tablet (50 mg total) by mouth at bedtime as needed for sleep.   30 tablet   0     For sleep.    BP 143/80  Pulse 71  Temp(Src) 98.2 F (36.8 C) (Oral)  Resp 20  Ht 5\' 3"  (1.6 m)  Wt 170 lb (77.111 kg)  BMI 30.12 kg/m2  SpO2 100%  LMP 08/12/2013 Physical Exam  Nursing note and vitals reviewed. Constitutional: She is oriented to person, place, and time. She appears well-developed and well-nourished.  Non-toxic appearance.  HENT:  Head: Normocephalic.  Right Ear: Tympanic membrane and external ear normal.  Left Ear: Tympanic membrane and external ear normal.  Eyes: EOM and lids are normal. Pupils  are equal, round, and reactive to light.  Neck: Normal range of motion. Neck supple. Carotid bruit is not present.  Cardiovascular: Normal rate, regular rhythm, normal heart sounds, intact distal pulses and normal pulses.   Pulmonary/Chest: Breath sounds normal. No respiratory distress.  Patient speaks in complete sentences without problem.  Abdominal: Soft. Bowel sounds are normal. There is no tenderness. There is no guarding.  Diffuse abdominal soreness. Pain in the right lower quadrant extending to the right flank.  Musculoskeletal: Normal range of motion.  Lymphadenopathy:       Head (right side): No submandibular adenopathy present.       Head (left side): No submandibular adenopathy present.    She has no cervical adenopathy.  Neurological: She is alert and oriented to person, place, and time. She has normal strength. No cranial nerve deficit or sensory deficit.  Skin: Skin is warm and dry. No rash noted.  Psychiatric: She has a normal mood and affect. Her speech is normal.    ED Course  Procedures (including critical care time) Labs Review Labs Reviewed  BASIC METABOLIC PANEL  CBC WITH DIFFERENTIAL  HEPATIC FUNCTION PANEL  PREGNANCY, URINE  URINALYSIS, ROUTINE W REFLEX MICROSCOPIC  SAMPLE TO BLOOD BANK   Imaging Review No results found.   EKG Interpretation None      MDM Patient has history of irregular menstrual cycles for 2-3 months. She has had vaginal bleeding with some clotting since April 1. She now has 3 days of right side and flank area pain.  Pregnancy test is negative. Urinalysis reveals a clear low specimen with a specific gravity of greater than 1.030. There is a trace of leukocyte esterase. There are many squamous epithelial cells, many bacteria. Complete blood count is well within normal limits.  No orthostatic vital sign changes. CT abdomen pelvis without contrast is negative for kidney stone, appendicitis, or mass effect.  Patient advised to increase  fluids. To see her gynecology specialist as sone as possible. Increase foods high in iron. Prescription for ibuprofen 800 mg and Norco 5 mg given to the patient.    Final diagnoses:  None    *I have reviewed nursing notes, vital signs, and all appropriate lab and imaging results for this patient.Kathie Dike**    Teddy Pena M Syrita Dovel, PA-C 09/01/13 [phone]

## 2016-05-29 ENCOUNTER — Emergency Department (HOSPITAL_COMMUNITY): Payer: Medicaid Other

## 2016-05-29 ENCOUNTER — Emergency Department (HOSPITAL_COMMUNITY)
Admission: EM | Admit: 2016-05-29 | Discharge: 2016-05-29 | Disposition: A | Discharge status: Home / Self Care | Payer: Medicaid Other | Attending: Emergency Medicine | Admitting: Emergency Medicine

## 2016-05-29 ENCOUNTER — Encounter (HOSPITAL_COMMUNITY): Payer: Self-pay | Admitting: Emergency Medicine

## 2016-05-29 DIAGNOSIS — R52 Pain, unspecified: Secondary | ICD-10-CM

## 2016-05-29 DIAGNOSIS — R1011 Right upper quadrant pain: Secondary | ICD-10-CM

## 2016-05-29 DIAGNOSIS — R0602 Shortness of breath: Secondary | ICD-10-CM | POA: Insufficient documentation

## 2016-05-29 DIAGNOSIS — R110 Nausea: Secondary | ICD-10-CM | POA: Insufficient documentation

## 2016-05-29 DIAGNOSIS — R0789 Other chest pain: Secondary | ICD-10-CM

## 2016-05-29 DIAGNOSIS — R05 Cough: Secondary | ICD-10-CM | POA: Insufficient documentation

## 2016-05-29 LAB — COMPREHENSIVE METABOLIC PANEL
ALBUMIN: 3.7 g/dL (ref 3.5–5.0)
ALT: 11 U/L — AB (ref 14–54)
AST: 11 U/L — ABNORMAL LOW (ref 15–41)
Alkaline Phosphatase: 50 U/L (ref 38–126)
Anion gap: 5 (ref 5–15)
BILIRUBIN TOTAL: 0.5 mg/dL (ref 0.3–1.2)
BUN: 10 mg/dL (ref 6–20)
CALCIUM: 9 mg/dL (ref 8.9–10.3)
CO2: 29 mmol/L (ref 22–32)
Chloride: 104 mmol/L (ref 101–111)
Creatinine, Ser: 0.67 mg/dL (ref 0.44–1.00)
GFR calc Af Amer: >60 mL/min (ref >60)
GFR calc non Af Amer: >60 mL/min (ref >60)
GLUCOSE: 104 mg/dL — AB (ref 65–99)
Potassium: 3.9 mmol/L (ref 3.5–5.1)
SODIUM: 138 mmol/L (ref 135–145)
TOTAL PROTEIN: 6.8 g/dL (ref 6.5–8.1)

## 2016-05-29 LAB — CBC
HEMATOCRIT: 37.9 % (ref 36.0–46.0)
Hemoglobin: 11.9 g/dL — ABNORMAL LOW (ref 12.0–15.0)
MCH: 27.9 pg (ref 26.0–34.0)
MCHC: 31.4 g/dL (ref 30.0–36.0)
MCV: 89 fL (ref 78.0–100.0)
Platelets: 245 10*3/uL (ref 150–400)
RBC: 4.26 MIL/uL (ref 3.87–5.11)
RDW: 14.3 % (ref 11.5–15.5)
WBC: 7.7 10*3/uL (ref 4.0–10.5)

## 2016-05-29 LAB — URINALYSIS, ROUTINE W REFLEX MICROSCOPIC
BILIRUBIN URINE: NEGATIVE
GLUCOSE, UA: NEGATIVE mg/dL
Hgb urine dipstick: NEGATIVE
KETONES UR: NEGATIVE mg/dL
Leukocytes, UA: NEGATIVE
Nitrite: NEGATIVE
Protein, ur: NEGATIVE mg/dL
SPECIFIC GRAVITY, URINE: 1.008 (ref 1.005–1.030)
pH: 7 (ref 5.0–8.0)

## 2016-05-29 LAB — PREGNANCY, URINE: PREG TEST UR: NEGATIVE

## 2016-05-29 LAB — TROPONIN I: Troponin I: <0.03 ng/mL (ref <0.03)

## 2016-05-29 LAB — LIPASE, BLOOD: Lipase: 28 U/L (ref 11–51)

## 2016-05-29 MED ORDER — SODIUM CHLORIDE 0.9 % IV SOLN
INTRAVENOUS | Status: DC
  Administered 2016-05-29: 12:00:00 via INTRAVENOUS

## 2016-05-29 MED ORDER — ONDANSETRON 4 MG PO TBDP: 4.0000 mg | ORAL_TABLET | Freq: Three times a day (TID) | ORAL | 0 refills | Status: AC | PRN

## 2016-05-29 MED ORDER — SODIUM CHLORIDE 0.9 % IV BOLUS (SEPSIS)
1000.0000 mL | Freq: Once | INTRAVENOUS | Status: AC
  Administered 2016-05-29: 1000 mL via INTRAVENOUS

## 2016-05-29 MED ORDER — HYDROCODONE-ACETAMINOPHEN 5-325 MG PO TABS: 1.0000 | ORAL_TABLET | ORAL | 0 refills | Status: AC | PRN

## 2016-05-29 NOTE — ED Triage Notes (Signed)
EMS reports pt c/o central chest/right sided sharp chest pain that started x2 days ago with periods of SOB and nausea. PT was given 324 aspirin and 1 nitro glycerin tablet in route with decreased chest pain from a 10/10 to a 6/10. 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

## 2016-05-29 NOTE — ED Provider Notes (Signed)
[facility]-EMERGENCY DEPT Provider Note   CSN: [id number] Arrival date & time: 05/29/16  1133  By signing my name below, I, [name], attest that this documentation has been prepared under the direction and in the presence of Jacalyn Lefevre, MD. Electronically Signed: [name], Scribe. 05/29/16. 11:41 AM. History   Chief Complaint Chief Complaint  Patient presents with  . Chest Pain    The history is provided by the patient. No language interpreter was used.   HPI Comments: Brandi Cross is a 46 y.o. female who presents to the Emergency Department complaining of centralized CP that radiates to her right breast that began 2 days ago. She also reports associated intermittent episodes of SOB and nausea. Patient states that this pain worsens while inhaling. Patient reports a mild cough. She denies hx of smoking. She also denies abdominal pain and leg pain. Per EMS: Patient was give ASA and NTG en route with mild improvement.  Past Medical History:  Diagnosis Date  . Anxiety    pt denies    Patient Active Problem List   Diagnosis Date Noted  . Depression 08/17/2012    Past Surgical History:  Procedure Laterality Date  . WISDOM TOOTH EXTRACTION      OB History    No data available       Home Medications    Prior to Admission medications   Medication Sig Start Date End Date Taking? Authorizing Provider  acetaminophen (TYLENOL) 325 MG tablet Take 650 mg by mouth every 6 (six) hours as needed for moderate pain.   Yes Historical Provider, MD  Melatonin 3 MG TABS Take 1 tablet by mouth daily as needed (sleep).   Yes Historical Provider, MD  HYDROcodone-acetaminophen (NORCO/VICODIN) 5-325 MG tablet Take 1 tablet by mouth every 4 (four) hours as needed. 05/29/16   Jacalyn Lefevre, MD  ondansetron (ZOFRAN ODT) 4 MG disintegrating tablet Take 1 tablet (4 mg total) by mouth every 8 (eight) hours as needed for nausea or vomiting. 05/29/16   Jacalyn Lefevre, MD    Family  History History reviewed. No pertinent family history.  Social History Social History  Substance Use Topics  . Smoking status: Never Smoker  . Smokeless tobacco: Never Used  . Alcohol use No     Allergies   Patient has no known allergies.   Review of Systems Review of Systems  Constitutional: Negative for fever.  Respiratory: Positive for shortness of breath.   Cardiovascular: Positive for chest pain.  Gastrointestinal: Positive for nausea.  All other systems reviewed and are negative.    Physical Exam Updated Vital Signs BP (!) 156/101   Pulse 69   Temp 98 F (36.7 C) (Oral)   Resp 22   Ht 5\' 3"  (1.6 m)   Wt 145 lb (65.8 kg)   LMP 05/12/2016   SpO2 100%   BMI 25.69 kg/m   Physical Exam  Constitutional: She is oriented to person, place, and time. She appears well-developed and well-nourished. No distress.  HENT:  Head: Normocephalic and atraumatic.  Right Ear: External ear normal.  Left Ear: External ear normal.  Nose: Nose normal.  Mouth/Throat: Oropharynx is clear and moist.  Eyes: Conjunctivae and EOM are normal. Pupils are equal, round, and reactive to light.  Neck: Normal range of motion. Neck supple.  Cardiovascular: Normal rate and regular rhythm.  Exam reveals no gallop and no friction rub.   No murmur heard. Pulmonary/Chest: Effort normal and breath sounds normal.  Abdominal: There is tenderness (RUQ).  Musculoskeletal: Normal range of motion.  Neurological: She is alert and oriented to person, place, and time.  Skin: Skin is warm.  Psychiatric: She has a normal mood and affect. Her behavior is normal. Judgment and thought content normal.  Nursing note and vitals reviewed.    ED Treatments / Results  DIAGNOSTIC STUDIES: Oxygen Saturation is 98% on RA, normal by my interpretation.    COORDINATION OF CARE: 11:42 AM Discussed treatment plan with pt at bedside and pt agreed to plan.  Labs (all labs ordered are listed, but only abnormal  results are displayed) Labs Reviewed  CBC - Abnormal; Notable for the following:       Result Value   Hemoglobin 11.9 (*)    All other components within normal limits  COMPREHENSIVE METABOLIC PANEL - Abnormal; Notable for the following:    Glucose, Bld 104 (*)    AST 11 (*)    ALT 11 (*)    All other components within normal limits  URINALYSIS, ROUTINE W REFLEX MICROSCOPIC - Abnormal; Notable for the following:    Color, Urine STRAW (*)    All other components within normal limits  TROPONIN I  LIPASE, BLOOD  PREGNANCY, URINE    EKG  EKG Interpretation  Date/Time:  Thursday May 29 2016 11:40:43 EST Ventricular Rate:  79 PR Interval:    QRS Duration: 100 QT Interval:  430 QTC Calculation: 493 R Axis:   87 Text Interpretation:  Sinus rhythm ST elev, probable normal early repol pattern Borderline prolonged QT interval Confirmed by [facility] MD, Najae Rathert ([id number]) on 05/29/2016 12:54:23 PM       Radiology Dg Chest 2 View  Result Date: 05/29/2016 CLINICAL DATA:  Chest pain with shortness of breath and cough EXAM: CHEST  2 VIEW COMPARISON:  None. FINDINGS: Lungs are clear. The heart size and pulmonary vascularity are normal. No adenopathy. No bone lesions. No pneumothorax. IMPRESSION: No edema or consolidation. Electronically Signed   By: Bretta Bang III M.D.   On: 05/29/2016 12:25   US Abdomen Limited Ruq  Result Date: 05/29/2016 CLINICAL DATA:  Right upper quadrant pain for several days EXAM: US ABDOMEN LIMITED - RIGHT UPPER QUADRANT COMPARISON:  09/01/2013 FINDINGS: Gallbladder: No gallstones or wall thickening visualized. No sonographic Murphy sign noted by sonographer. Common bile duct: Diameter: 2 mm. Liver: No focal lesion identified. Within normal limits in parenchymal echogenicity. IMPRESSION: No acute abnormality noted. Electronically Signed   By: Alcide Clever M.D.   On: 05/29/2016 12:17    Procedures Procedures (including critical care time)  Medications Ordered in  ED Medications  sodium chloride 0.9 % bolus 1,000 mL (1,000 mLs Intravenous New Bag/Given 05/29/16 1228)    And  0.9 %  sodium chloride infusion ( Intravenous New Bag/Given 05/29/16 1229)     Initial Impression / Assessment and Plan / ED Course  I have reviewed the triage vital signs and the nursing notes.  Pertinent labs & imaging results that were available during my care of the patient were reviewed by me and considered in my medical decision making (see chart for details).  Clinical Course    Pt is feeling better.  She is low risk for CAD as she has no risk factors.  Pt's sx c/w biliary colic.  She was told her GB US was ok, but if she has continued sx, she may need a HIDA scan.  She is given the number of GI.  She knows to return if worse.  Final Clinical Impressions(s) /  ED Diagnoses   Final diagnoses:  Pain  Atypical chest pain  RUQ pain    New Prescriptions New Prescriptions   HYDROCODONE-ACETAMINOPHEN (NORCO/VICODIN) 5-325 MG TABLET    Take 1 tablet by mouth every 4 (four) hours as needed.   ONDANSETRON (ZOFRAN ODT) 4 MG DISINTEGRATING TABLET    Take 1 tablet (4 mg total) by mouth every 8 (eight) hours as needed for nausea or vomiting.   I personally performed the services described in this documentation, which was scribed in my presence. The recorded information has been reviewed and is accurate.    Jacalyn LefevreJulie Alysia Scism, MD 05/29/16 1410

## 2017-09-30 ENCOUNTER — Encounter (HOSPITAL_COMMUNITY): Payer: Self-pay

## 2017-09-30 ENCOUNTER — Emergency Department (HOSPITAL_COMMUNITY)
Admission: EM | Admit: 2017-09-30 | Discharge: 2017-10-01 | Disposition: A | Discharge status: Home / Self Care | Payer: Self-pay | Attending: Emergency Medicine | Admitting: Emergency Medicine

## 2017-09-30 ENCOUNTER — Other Ambulatory Visit: Payer: Self-pay

## 2017-09-30 DIAGNOSIS — R1013 Epigastric pain: Secondary | ICD-10-CM | POA: Insufficient documentation

## 2017-09-30 DIAGNOSIS — F419 Anxiety disorder, unspecified: Secondary | ICD-10-CM | POA: Insufficient documentation

## 2017-09-30 DIAGNOSIS — F329 Major depressive disorder, single episode, unspecified: Secondary | ICD-10-CM | POA: Insufficient documentation

## 2017-09-30 LAB — COMPREHENSIVE METABOLIC PANEL
ALBUMIN: 4.4 g/dL (ref 3.5–5.0)
ALK PHOS: 49 U/L (ref 38–126)
ALT: 12 U/L — AB (ref 14–54)
AST: 16 U/L (ref 15–41)
Anion gap: 10 (ref 5–15)
BUN: 14 mg/dL (ref 6–20)
CALCIUM: 9.5 mg/dL (ref 8.9–10.3)
CHLORIDE: 103 mmol/L (ref 101–111)
CO2: 25 mmol/L (ref 22–32)
CREATININE: 0.74 mg/dL (ref 0.44–1.00)
GFR calc non Af Amer: >60 mL/min (ref >60)
GLUCOSE: 92 mg/dL (ref 65–99)
Potassium: 3.5 mmol/L (ref 3.5–5.1)
SODIUM: 138 mmol/L (ref 135–145)
Total Bilirubin: 1.1 mg/dL (ref 0.3–1.2)
Total Protein: 7.7 g/dL (ref 6.5–8.1)

## 2017-09-30 LAB — CBC WITH DIFFERENTIAL/PLATELET
Basophils Absolute: 0 10*3/uL (ref 0.0–0.1)
Basophils Relative: 0 %
EOS ABS: 0 10*3/uL (ref 0.0–0.7)
EOS PCT: 0 %
HCT: 38.2 % (ref 36.0–46.0)
HEMOGLOBIN: 12.2 g/dL (ref 12.0–15.0)
LYMPHS ABS: 1.9 10*3/uL (ref 0.7–4.0)
LYMPHS PCT: 25 %
MCH: 27.5 pg (ref 26.0–34.0)
MCHC: 31.9 g/dL (ref 30.0–36.0)
MCV: 86.2 fL (ref 78.0–100.0)
MONOS PCT: 3 %
Monocytes Absolute: 0.2 10*3/uL (ref 0.1–1.0)
Neutro Abs: 5.5 10*3/uL (ref 1.7–7.7)
Neutrophils Relative %: 72 %
Platelets: 250 10*3/uL (ref 150–400)
RBC: 4.43 MIL/uL (ref 3.87–5.11)
RDW: 14.3 % (ref 11.5–15.5)
WBC: 7.7 10*3/uL (ref 4.0–10.5)

## 2017-09-30 LAB — LIPASE, BLOOD: Lipase: 26 U/L (ref 11–51)

## 2017-09-30 MED ORDER — ONDANSETRON HCL 4 MG/2ML IJ SOLN
4.0000 mg | Freq: Once | INTRAMUSCULAR | Status: AC
  Administered 2017-09-30: 4 mg via INTRAVENOUS
  Filled 2017-09-30: qty 2

## 2017-09-30 MED ORDER — SODIUM CHLORIDE 0.9 % IV BOLUS
500.0000 mL | Freq: Once | INTRAVENOUS | Status: AC
  Administered 2017-09-30: 500 mL via INTRAVENOUS

## 2017-09-30 MED ORDER — GI COCKTAIL ~~LOC~~
30.0000 mL | Freq: Once | ORAL | Status: AC
  Administered 2017-09-30: 30 mL via ORAL
  Filled 2017-09-30: qty 30

## 2017-09-30 NOTE — ED Provider Notes (Signed)
[facility] EMERGENCY DEPARTMENT Provider Note   CSN: [id number] Arrival date & time: 09/30/17  2208     History   Chief Complaint Chief Complaint  Patient presents with  . Abdominal Pain    HPI Brandi Cross is a 47 y.o. female.  HPI Patient presents with epigastric to right upper quadrant to right flank pain for the last 3 days.  Has come and gone somewhat.  Nausea.  No vomiting or diarrhea but has had decreased oral intake.  States she is not hungry.  No fevers or chills.  No diarrhea or constipation.  Has had epigastric and right upper quadrant pain in the past but states it is not gone to the back previously. Past Medical History:  Diagnosis Date  . Anxiety    pt denies    Patient Active Problem List   Diagnosis Date Noted  . Depression 08/17/2012    Past Surgical History:  Procedure Laterality Date  . WISDOM TOOTH EXTRACTION       OB History   None      Home Medications    Prior to Admission medications   Medication Sig Start Date End Date Taking? Authorizing Provider  acetaminophen (TYLENOL) 325 MG tablet Take 650 mg by mouth every 6 (six) hours as needed for moderate pain.    [provider]  HYDROcodone-acetaminophen (NORCO/VICODIN) 5-325 MG tablet Take 1 tablet by mouth every 4 (four) hours as needed. 05/29/16   Jacalyn Lefevre, MD  Melatonin 3 MG TABS Take 1 tablet by mouth daily as needed (sleep).    [provider]  ondansetron (ZOFRAN ODT) 4 MG disintegrating tablet Take 1 tablet (4 mg total) by mouth every 8 (eight) hours as needed for nausea or vomiting. 05/29/16   Jacalyn Lefevre, MD    Family History No family history on file.  Social History Social History   Tobacco Use  . Smoking status: Never Smoker  . Smokeless tobacco: Never Used  Substance Use Topics  . Alcohol use: No  . Drug use: No     Allergies   Patient has no known allergies.   Review of Systems Review of Systems  Constitutional: Positive for  appetite change.  HENT: Negative for congestion.   Eyes: Negative for photophobia.  Respiratory: Negative for shortness of breath.   Cardiovascular: Negative for chest pain.  Gastrointestinal: Positive for abdominal pain and nausea. Negative for vomiting.  Endocrine: Negative for polyuria.  Genitourinary: Negative for flank pain.  Musculoskeletal: Negative for back pain.  Neurological: Negative for weakness.  Hematological: Negative for adenopathy.  Psychiatric/Behavioral: Negative for confusion.     Physical Exam Updated Vital Signs BP (!) 165/97 (BP Location: Left Arm)   Pulse 68   Temp 98.3 F (36.8 C) (Oral)   Resp 18   Ht  (1.626 m)   Wt 72.6 kg (160 lb)   LMP 09/27/2017   SpO2 98%   BMI 27.46 kg/m   Physical Exam  Constitutional: She appears well-developed.  HENT:  Head: Normocephalic.  Eyes: Pupils are equal, round, and reactive to light.  Cardiovascular: Regular rhythm.  Pulmonary/Chest: Breath sounds normal.  Abdominal: Normal appearance.  Epigastric to right upper quadrant tenderness without rebound or guarding.  No hernia palpated.  Some tenderness and right CVA area.  Genitourinary:  Genitourinary Comments: Mild CVA tenderness on right side.  Neurological: She is alert.  Skin: Skin is warm.     ED Treatments / Results  Labs (all labs ordered are listed, but  only abnormal results are displayed) Labs Reviewed  CBC WITH DIFFERENTIAL/PLATELET  URINALYSIS, ROUTINE W REFLEX MICROSCOPIC  PREGNANCY, URINE  COMPREHENSIVE METABOLIC PANEL  LIPASE, BLOOD    EKG None  Radiology No results found.  Procedures Procedures (including critical care time)  Medications Ordered in ED Medications  sodium chloride 0.9 % bolus 500 mL (500 mLs Intravenous New Bag/Given 09/30/17 2301)  ondansetron (ZOFRAN) injection 4 mg (4 mg Intravenous Given 09/30/17 2301)  gi cocktail (Maalox,Lidocaine,Donnatal) (30 mLs Oral Given 09/30/17 2302)     Initial Impression /  Assessment and Plan / ED Course  I have reviewed the triage vital signs and the nursing notes.  Pertinent labs & imaging results that were available during my care of the patient were reviewed by me and considered in my medical decision making (see chart for details).     Patient with epigastric to right upper quadrant to right flank pain.  Recent ultrasound reviewed and no stones at that time.  Lab work pending.  Care will be turned over to Dr. Bebe Shaggy.  Final Clinical Impressions(s) / ED Diagnoses   Final diagnoses:  Epigastric pain    ED Discharge Orders    None       Benjiman Core, MD 09/30/17 2353

## 2017-09-30 NOTE — ED Triage Notes (Signed)
Upper abd and eipgastric pain x 3 days, reports as "burning" type pain, radiates around to her right flank. Pt c/o nausea, no vomiting or diarrhea

## 2017-10-01 LAB — URINALYSIS, ROUTINE W REFLEX MICROSCOPIC
BACTERIA UA: NONE SEEN
Glucose, UA: NEGATIVE mg/dL
Ketones, ur: 20 mg/dL — AB
Nitrite: NEGATIVE
PH: 5 (ref 5.0–8.0)
Protein, ur: 30 mg/dL — AB
RBC / HPF: >50 RBC/hpf — ABNORMAL HIGH (ref 0–5)

## 2017-10-01 LAB — PREGNANCY, URINE: PREG TEST UR: NEGATIVE

## 2017-10-01 MED ORDER — FENTANYL CITRATE (PF) 100 MCG/2ML IJ SOLN
50.0000 ug | Freq: Once | INTRAMUSCULAR | Status: AC
Start: 2017-10-01 — End: 2017-10-01
  Administered 2017-10-01: 50 ug via INTRAVENOUS
  Filled 2017-10-01: qty 2

## 2017-10-01 NOTE — ED Provider Notes (Signed)
Assumed care at sign to follow-up urinalysis.  Urine is likely contaminated.  She reports she is on her menstrual cycle. Urine culture sent She is feeling improved. Abdominal exam is unremarkable. Denies chest pain at this time She feels comfortable for discharge.   Zadie Rhine, MD 10/01/17 0111

## 2017-10-01 NOTE — Discharge Instructions (Addendum)

## 2017-10-02 LAB — URINE CULTURE

## 2020-11-27 ENCOUNTER — Other Ambulatory Visit: Payer: Self-pay

## 2020-11-27 ENCOUNTER — Encounter (HOSPITAL_COMMUNITY): Payer: Self-pay

## 2020-11-27 ENCOUNTER — Emergency Department (HOSPITAL_COMMUNITY): Payer: Self-pay

## 2020-11-27 ENCOUNTER — Emergency Department (HOSPITAL_COMMUNITY)
Admission: EM | Admit: 2020-11-27 | Discharge: 2020-11-27 | Disposition: A | Discharge status: Home / Self Care | Payer: Self-pay | Attending: Emergency Medicine | Admitting: Emergency Medicine

## 2020-11-27 DIAGNOSIS — I639 Cerebral infarction, unspecified: Secondary | ICD-10-CM | POA: Insufficient documentation

## 2020-11-27 DIAGNOSIS — G510 Bell's palsy: Secondary | ICD-10-CM

## 2020-11-27 DIAGNOSIS — Z20822 Contact with and (suspected) exposure to covid-19: Secondary | ICD-10-CM | POA: Insufficient documentation

## 2020-11-27 DIAGNOSIS — G4489 Other headache syndrome: Secondary | ICD-10-CM

## 2020-11-27 LAB — URINALYSIS, ROUTINE W REFLEX MICROSCOPIC
Bilirubin Urine: NEGATIVE
Glucose, UA: NEGATIVE mg/dL
Hgb urine dipstick: NEGATIVE
Ketones, ur: NEGATIVE mg/dL
Leukocytes,Ua: NEGATIVE
Nitrite: NEGATIVE
Protein, ur: NEGATIVE mg/dL
Specific Gravity, Urine: >1.046 — ABNORMAL HIGH (ref 1.005–1.030)
pH: 7 (ref 5.0–8.0)

## 2020-11-27 LAB — COMPREHENSIVE METABOLIC PANEL
ALT: 15 U/L (ref 0–44)
AST: 16 U/L (ref 15–41)
Albumin: 4.4 g/dL (ref 3.5–5.0)
Alkaline Phosphatase: 70 U/L (ref 38–126)
Anion gap: 8 (ref 5–15)
BUN: 11 mg/dL (ref 6–20)
CO2: 25 mmol/L (ref 22–32)
Calcium: 9.2 mg/dL (ref 8.9–10.3)
Chloride: 102 mmol/L (ref 98–111)
Creatinine, Ser: 0.65 mg/dL (ref 0.44–1.00)
GFR, Estimated: >60 mL/min (ref >60)
Glucose, Bld: 95 mg/dL (ref 70–99)
Potassium: 3.6 mmol/L (ref 3.5–5.1)
Sodium: 135 mmol/L (ref 135–145)
Total Bilirubin: 0.7 mg/dL (ref 0.3–1.2)
Total Protein: 7.7 g/dL (ref 6.5–8.1)

## 2020-11-27 LAB — DIFFERENTIAL
Abs Immature Granulocytes: 0.03 10*3/uL (ref 0.00–0.07)
Basophils Absolute: 0.1 10*3/uL (ref 0.0–0.1)
Basophils Relative: 1 %
Eosinophils Absolute: 0.1 10*3/uL (ref 0.0–0.5)
Eosinophils Relative: 2 %
Immature Granulocytes: 0 %
Lymphocytes Relative: 26 %
Lymphs Abs: 1.8 10*3/uL (ref 0.7–4.0)
Monocytes Absolute: 0.5 10*3/uL (ref 0.1–1.0)
Monocytes Relative: 8 %
Neutro Abs: 4.3 10*3/uL (ref 1.7–7.7)
Neutrophils Relative %: 63 %

## 2020-11-27 LAB — RAPID URINE DRUG SCREEN, HOSP PERFORMED
Amphetamines: NOT DETECTED
Barbiturates: NOT DETECTED
Benzodiazepines: NOT DETECTED
Cocaine: NOT DETECTED
Opiates: POSITIVE — AB
Tetrahydrocannabinol: NOT DETECTED

## 2020-11-27 LAB — PROTIME-INR
INR: 1 (ref 0.8–1.2)
Prothrombin Time: 12.9 seconds (ref 11.4–15.2)

## 2020-11-27 LAB — ETHANOL: Alcohol, Ethyl (B): <10 mg/dL (ref <10)

## 2020-11-27 LAB — CBC
HCT: 40.8 % (ref 36.0–46.0)
Hemoglobin: 13 g/dL (ref 12.0–15.0)
MCH: 28.7 pg (ref 26.0–34.0)
MCHC: 31.9 g/dL (ref 30.0–36.0)
MCV: 90.1 fL (ref 80.0–100.0)
Platelets: 280 10*3/uL (ref 150–400)
RBC: 4.53 MIL/uL (ref 3.87–5.11)
RDW: 14.6 % (ref 11.5–15.5)
WBC: 6.9 10*3/uL (ref 4.0–10.5)
nRBC: 0 % (ref 0.0–0.2)

## 2020-11-27 LAB — I-STAT CHEM 8, ED
BUN: 11 mg/dL (ref 6–20)
Calcium, Ion: 1.22 mmol/L (ref 1.15–1.40)
Chloride: 102 mmol/L (ref 98–111)
Creatinine, Ser: 0.6 mg/dL (ref 0.44–1.00)
Glucose, Bld: 94 mg/dL (ref 70–99)
HCT: 43 % (ref 36.0–46.0)
Hemoglobin: 14.6 g/dL (ref 12.0–15.0)
Potassium: 3.8 mmol/L (ref 3.5–5.1)
Sodium: 138 mmol/L (ref 135–145)
TCO2: 24 mmol/L (ref 22–32)

## 2020-11-27 LAB — RESP PANEL BY RT-PCR (FLU A&B, COVID) ARPGX2
Influenza A by PCR: NEGATIVE
Influenza B by PCR: NEGATIVE
SARS Coronavirus 2 by RT PCR: NEGATIVE

## 2020-11-27 LAB — CBG MONITORING, ED: Glucose-Capillary: 90 mg/dL (ref 70–99)

## 2020-11-27 LAB — POC URINE PREG, ED: Preg Test, Ur: NEGATIVE

## 2020-11-27 LAB — APTT: aPTT: 24 seconds (ref 24–36)

## 2020-11-27 MED ORDER — VALACYCLOVIR HCL 500 MG PO TABS
1000.0000 mg | ORAL_TABLET | Freq: Once | ORAL | Status: AC
  Administered 2020-11-27: 1000 mg via ORAL
  Filled 2020-11-27: qty 2

## 2020-11-27 MED ORDER — GADOBUTROL 1 MMOL/ML IV SOLN
8.0000 mL | Freq: Once | INTRAVENOUS | Status: AC | PRN
  Administered 2020-11-27: 8 mL via INTRAVENOUS

## 2020-11-27 MED ORDER — METOCLOPRAMIDE HCL 5 MG/ML IJ SOLN
10.0000 mg | Freq: Once | INTRAMUSCULAR | Status: AC
  Administered 2020-11-27: 10 mg via INTRAVENOUS
  Filled 2020-11-27: qty 2

## 2020-11-27 MED ORDER — HYPROMELLOSE (GONIOSCOPIC) 2.5 % OP SOLN: 1.0000 [drp] | Freq: Four times a day (QID) | OPHTHALMIC | 0 refills | Status: AC | PRN

## 2020-11-27 MED ORDER — VALACYCLOVIR HCL 1 G PO TABS: 1000.0000 mg | ORAL_TABLET | Freq: Three times a day (TID) | ORAL | 0 refills | Status: AC

## 2020-11-27 MED ORDER — PREDNISONE 10 MG PO TABS: 50.0000 mg | ORAL_TABLET | Freq: Every day | ORAL | 0 refills | Status: AC

## 2020-11-27 MED ORDER — PREDNISONE 50 MG PO TABS
60.0000 mg | ORAL_TABLET | Freq: Once | ORAL | Status: AC
  Administered 2020-11-27: 60 mg via ORAL
  Filled 2020-11-27: qty 1

## 2020-11-27 MED ORDER — KETOROLAC TROMETHAMINE 15 MG/ML IJ SOLN
15.0000 mg | Freq: Once | INTRAMUSCULAR | Status: AC
  Administered 2020-11-27: 15 mg via INTRAVENOUS
  Filled 2020-11-27: qty 1

## 2020-11-27 MED ORDER — IOHEXOL 350 MG/ML SOLN
100.0000 mL | Freq: Once | INTRAVENOUS | Status: AC | PRN
  Administered 2020-11-27: 100 mL via INTRAVENOUS

## 2020-11-27 MED ORDER — SODIUM CHLORIDE 0.9 % IV BOLUS
500.0000 mL | Freq: Once | INTRAVENOUS | Status: AC
  Administered 2020-11-27: 500 mL via INTRAVENOUS

## 2020-11-27 NOTE — ED Notes (Signed)
Patient transported to MRI 

## 2020-11-27 NOTE — Consult Note (Addendum)
[facility] Telemedicine Consult   Requesting Provider: Derwood Kaplan Consult Participants: Myself, patient, [facility] nurse Marchelle Folks, bedside staff Location of the provider: [facility] Location of the patient: [facility]   This consult was provided via telemedicine with 2-way video and audio communication. The patient/family was informed that care would be provided in this way and agreed to receive care in this manner.   Chief Complaint: Headache, right sided sensory changes   HPI: This is a 50 year old woman with past medical history significant for limited access of medical care, anxiety/depression, presenting with headache and right-sided facial weakness with some subtle right face arm and leg sensory changes.  She reports that she has very frequent headaches but her headache today is unilateral on the right side and much more severe than her typical headache.  It started at around 4 or 5 PM last night.  She went to bed around 7 or 8 PM and when she awoke this morning she noted that she had right-sided facial droop and sensory changes.  She notes she is also an unvaccinated against COVID-19 but she has very limited exposure to anyone who might be sick as the only time she typically leaves the house is for her daughter's appointments for cerebral palsy.  Associated with her headache she has been having some mild dizziness and trouble walking but denies double vision or other focal neurological changes.  She does endorse body ache x1 week but denies any other infectious symptoms.  She additionally denies any bowel or bladder issues  LKW: 4 or 5 PM on 7/4 tpa given?: No, due to out of the window IR Thrombectomy? No, no LVO Modified Rankin Scale: 0-Completely asymptomatic and back to baseline post- stroke Time of teleneurologist evaluation: 11:20 AM  Exam: Vitals:   11/27/20 1130  BP: (!) 189/117  Pulse: 83  Resp: 13  SpO2: 99%    General: Mildly uncomfortable at rest, exam  notable for reduced blink rate of the right eye and incomplete eye closure, with reduced eyebrow movement on the right as well Pulmonary: breathing comfortably Cardiac: regular rate and rhythm on monitor   NIH Stroke scale 1A: Level of Consciousness - 0 1B: Ask Month and Age - 0 1C: 'Blink Eyes' & 'Squeeze Hands' - 0 2: Test Horizontal Extraocular Movements - 0 3: Test Visual Fields - 0 4: Test Facial Palsy - 2 5A: Test Left Arm Motor Drift - 0 5B: Test Right Arm Motor Drift - 0 6A: Test Left Leg Motor Drift - 1 6B: Test Right Leg Motor Drift - 1 7: Test Limb Ataxia - 0 8: Test Sensation - 1 (right sided slight sensory change in the face/arm/leg) 9: Test Language/Aphasia- 0 10: Test Dysarthria - 0 11: Test Extinction/Inattention - 0 NIHSS score: 5  Imaging Reviewed:  Head CT negative for acute intracranial process CTA negative for LVO CT perfusion negative for tissue at risk MRI brain negative for acute diffusion restriction, final sequences pending and radiology report pending  Labs reviewed in epic and pertinent values follow: CMP notable for creatinine of 0.65, within normal limits.  CBC also within normal limits.  Glucose 95  Assessment: This is a 50 year old woman presenting with severe headache, significant hypertension, with a history of anxiety/depression and limited prior access of health care for herself.  Examination is notable for right-sided facial weakness that appears to be a Bell's pattern on telemetry evaluation.  However she does also complain of mild right-sided sensory changes and lifts of both legs  weakly on examination  Recommendations:  -Given severe headache and right face/arm/leg sensory changes in addition to right facial droop, CTA and CT perfusion were recommended to rule out posterior circulation large vessel occlusion -MRI brain recommended to rule out stroke as sensory symptoms on the right arm face and leg would not fit a diagnosis of Bell's palsy,  and though these are subjective and mild -Treatment of blood pressure per standard hypertensive urgency/emergency protocols -If MRI brain is negative (or positive only for 7th cranial nerve enhancement), consider treatment for presumed Bell's with oral prednisone 60 mg daily for 1 week, and valacyclovir 1000 mg 3 times daily for one week, with outpatient neurology follow-up and strict return precautions for any worsening symptoms -Patient should be walked by ED provider prior to potential discharge to confirm that she is ambulatory with no severe symptoms  Addendum: Imaging reassuring, favor chronic microvascular changes over MS as eitiology of non-specific white matter changes. However, per ED patient remains symptomatic, dizzy/unsteady with ambulation and persistent right sided sensory change. Reflexes not yet assessed. Discussed patient may benefit from MRI C-spine and possibly full in-person neurological evaluation (neuro in person not available at [facility] at this time, so would need transfer to [facility] in that setting). Dr. Rhunette Croft will reassess, including reflexes, and update me if he feels transfer to [facility] would be helpful given limited capability for full assessment by video exam.    This patient is receiving care for possible acute neurological changes. There was >80 minutes of care by this provider at the time of service, including time for direct evaluation via telemedicine, review of medical records, imaging studies and discussion of findings with providers, the patient and/or family.  Discussed with Dr. Rhunette Croft via secure chart and telephone, as well as CT and MRI techs  Brooke Dare MD-PhD [facility] [phone] If 8pm-8am, please page neurology on call as listed in [facility].

## 2020-11-27 NOTE — ED Provider Notes (Signed)
[facility] EMERGENCY DEPARTMENT Provider Note   CSN: [id number] Arrival date & time: 11/27/20  1120  An emergency department physician performed an initial assessment on this suspected stroke patient at 1130 (Pt cleared by EDP and Neurologist).  History Chief Complaint  Patient presents with  . Dizziness    Brandi Cross is a 50 y.o. female.  HPI     50 year old female comes in with chief complaint of dizziness.  She has history of anxiety and admits to narcotic abuse in the past.  Currently she is not using any drugs and denies any heavy smoking.  Patient states that she was last normal yesterday around 5 PM.  She started having some headaches, the right-sided behind her right eye.  Headaches are transient.  This morning she noted that she was dizzy when she walks.  Additionally she has also appreciated some facial numbness on the right side and drooping.  No history of strokes.  She has had chronic headaches but the current headaches feel different.  Headaches are not described as thunderclap and at its peak they are about 6 out of 10.  No neck pain no occipital pain.  Patient denies any trauma.  Review of system is also negative for any vision distortion, slurred speech.  Past Medical History:  Diagnosis Date  . Anxiety    pt denies    Patient Active Problem List   Diagnosis Date Noted  . Depression 08/17/2012    Past Surgical History:  Procedure Laterality Date  . WISDOM TOOTH EXTRACTION       OB History   No obstetric history on file.     No family history on file.  Social History   Tobacco Use  . Smoking status: Never  . Smokeless tobacco: Never  Substance Use Topics  . Alcohol use: No  . Drug use: No    Home Medications Prior to Admission medications   Medication Sig Start Date End Date Taking? Authorizing Provider  acetaminophen (TYLENOL) 325 MG tablet Take 650 mg by mouth every 6 (six) hours as needed for moderate pain.   Yes [provider]  hydroxypropyl methylcellulose / hypromellose (ISOPTO TEARS / GONIOVISC) 2.5 % ophthalmic solution Place 1 drop into the right eye 4 (four) times daily as needed for dry eyes. 11/27/20  Yes Derwood Kaplan, MD  Melatonin 3 MG TABS Take 1 tablet by mouth daily as needed (sleep).   Yes [provider]  predniSONE (DELTASONE) 10 MG tablet Take 5 tablets (50 mg total) by mouth daily. 11/27/20  Yes Derwood Kaplan, MD  valACYclovir (VALTREX) 1000 MG tablet Take 1 tablet (1,000 mg total) by mouth 3 (three) times daily for 7 days. 11/27/20 12/04/20 Yes Derwood Kaplan, MD  HYDROcodone-acetaminophen (NORCO/VICODIN) 5-325 MG tablet Take 1 tablet by mouth every 4 (four) hours as needed. Patient not taking: Reported on 11/27/2020 05/29/16   Jacalyn Lefevre, MD  ondansetron (ZOFRAN ODT) 4 MG disintegrating tablet Take 1 tablet (4 mg total) by mouth every 8 (eight) hours as needed for nausea or vomiting. Patient not taking: Reported on 11/27/2020 05/29/16   Jacalyn Lefevre, MD    Allergies    Patient has no known allergies.  Review of Systems   Review of Systems  Constitutional:  Positive for activity change.  Eyes:  Negative for visual disturbance.  Cardiovascular:  Negative for chest pain.  Gastrointestinal:  Negative for nausea.  Neurological:  Positive for dizziness, facial asymmetry and numbness. Negative for speech difficulty and weakness.  All other systems reviewed and are negative.  Physical Exam Updated Vital Signs BP (!) 174/109   Pulse 72   Temp 98.2 F (36.8 C) (Oral)   Resp (!) 21   Ht 5\' 4"  (1.626 m)   Wt 72.6 kg   SpO2 100%   BMI 27.46 kg/m   Physical Exam Constitutional:      Appearance: She is well-developed.  HENT:     Head: Normocephalic and atraumatic.  Eyes:     Extraocular Movements: Extraocular movements intact.  Cardiovascular:     Rate and Rhythm: Normal rate.     Heart sounds: Normal heart sounds.  Pulmonary:     Effort: Pulmonary effort is  normal. No respiratory distress.  Abdominal:     General: There is no distension.     Palpations: Abdomen is soft.     Tenderness: There is no abdominal tenderness.  Musculoskeletal:     Cervical back: Neck supple.  Skin:    General: Skin is warm and dry.  Neurological:     Mental Status: She is alert and oriented to person, place, and time.     Motor: No weakness.     Coordination: Coordination normal.     Comments: Patient has mild right-sided facial droop and reports subjective numbness to the right side of the face and extremities.  Otherwise, no nystagmus, EOMI, visual fields are intact, cerebellar exam did not reveal any dysmetria and there is no slurred speech     ED Results / Procedures / Treatments   Labs (all labs ordered are listed, but only abnormal results are displayed) Labs Reviewed  RESP PANEL BY RT-PCR (FLU A&B, COVID) ARPGX2  PROTIME-INR  APTT  CBC  DIFFERENTIAL  COMPREHENSIVE METABOLIC PANEL  ETHANOL  URINALYSIS, ROUTINE W REFLEX MICROSCOPIC  RAPID URINE DRUG SCREEN, HOSP PERFORMED  I-STAT CHEM 8, ED  POC URINE PREG, ED  CBG MONITORING, ED    EKG EKG Interpretation  Date/Time:  Tuesday November 27 2020 11:51:53 EDT Ventricular Rate:  85 PR Interval:  157 QRS Duration: 106 QT Interval:  409 QTC Calculation: 487 R Axis:   97 Text Interpretation: Sinus rhythm Borderline right axis deviation Borderline prolonged QT interval No acute changes No significant change since last tracing Confirmed by 01-14-1988 Derwood Kaplan) on 11/27/2020 12:14:24 PM  Radiology MR BRAIN W WO CONTRAST  Result Date: 11/27/2020 CLINICAL DATA:  Neuro deficit, acute stroke suspected. Right-sided sensation loss. Question Bell's palsy versus stroke. EXAM: MRI HEAD WITHOUT AND WITH CONTRAST TECHNIQUE: Multiplanar, multiecho pulse sequences of the brain and surrounding structures were obtained without and with intravenous contrast. CONTRAST:  45mL GADAVIST GADOBUTROL 1 MMOL/ML IV SOLN  COMPARISON:  None. FINDINGS: Brain: No acute infarction, hemorrhage, hydrocephalus, extra-axial collection or mass lesion. Mild scattered T2/FLAIR hyperintensities within the white matter, mildly advanced for age. Vascular: See same day CTA. Skull and upper cervical spine: Normal marrow signal. Sinuses/Orbits: Clear sinuses.  Unremarkable orbits. Other: Small right mastoid effusion. No definite asymmetric/abnormal facial nerve enhancement within the limitations of standard MRI head technique (IAC protocol not performed). IMPRESSION: 1. No acute intracranial abnormality. Specifically, no acute infarct. 2. Mild scattered T2/FLAIR hyperintensities within the white matter, mildly advanced for age. This finding is nonspecific but can be seen in the setting of chronic microvascular ischemia, a demyelinating process such as multiple sclerosis, chronic migraines, or as the sequelae of a prior infectious or inflammatory process. 3. Small right mastoid effusion. Recommend correlation with the presence or absence of signs/symptoms of  otomastoiditis. Electronically Signed   By: Feliberto HartsFrederick S Jones MD   On: 11/27/2020 12:48   CT ANGIO HEAD NECK W WO CM W PERF (CODE STROKE)  Result Date: 11/27/2020 CLINICAL DATA:  Left-sided weakness and headache. EXAM: CT ANGIOGRAPHY HEAD AND NECK CT PERFUSION BRAIN TECHNIQUE: Multidetector CT imaging of the head and neck was performed using the standard protocol during bolus administration of intravenous contrast. Multiplanar CT image reconstructions and MIPs were obtained to evaluate the vascular anatomy. Carotid stenosis measurements (when applicable) are obtained utilizing NASCET criteria, using the distal internal carotid diameter as the denominator. Multiphase CT imaging of the brain was performed following IV bolus contrast injection. Subsequent parametric perfusion maps were calculated using RAPID software. CONTRAST:  100mL OMNIPAQUE IOHEXOL 350 MG/ML SOLN COMPARISON:  None. FINDINGS:  CT HEAD FINDINGS Brain: There is no evidence of an acute infarct, intracranial hemorrhage, mass, midline shift, or extra-axial fluid collection. The ventricles and sulci are normal. Hypodensities in the cerebral white matter bilaterally are nonspecific but compatible with mild chronic small vessel ischemic disease. Vascular: No hyperdense vessel. Skull: No fracture or suspicious osseous lesion. Sinuses/Orbits: Visualized paranasal sinuses and mastoid air cells are clear. Unremarkable orbits. Other: None. ASPECTS (Alberta Stroke Program Early CT Score) - Ganglionic level infarction (caudate, lentiform nuclei, internal capsule, insula, M1-M3 cortex): 7 - Supraganglionic infarction (M4-M6 cortex): 3 Total score (0-10 with 10 being normal): 10 Review of the MIP images confirms the above findings CTA NECK FINDINGS Aortic arch: Standard 3 vessel aortic arch with widely patent arch vessel origins. Right carotid system: Patent without evidence of a significant stenosis or dissection. Motion artifact through the proximal common carotid artery. Left carotid system: Patent without evidence of a significant stenosis or dissection. Minimal calcified and soft plaque in the proximal ICA. Vertebral arteries: Patent without evidence of a significant stenosis or dissection. Codominant. Skeleton: Mild cervical disc and facet degeneration. Other neck: Subcentimeter nodule in the left thyroid lobe for which no imaging follow-up is recommended. Increased number and size lymph nodes throughout the right neck measuring up to 8 mm in short axis. Upper chest: Clear lung apices. Review of the MIP images confirms the above findings CTA HEAD FINDINGS Anterior circulation: The internal carotid arteries are widely patent from skull base to carotid termini. ACAs and MCAs are patent without evidence of a proximal branch occlusion or significant proximal stenosis. No aneurysm is identified. Posterior circulation: The intracranial vertebral arteries  are widely patent to the basilar. Patent PICA and SCA origins are seen bilaterally. The basilar artery is widely patent. Both PCAs are patent without evidence of a significant proximal stenosis. No aneurysm is identified. Venous sinuses: Patent. Anatomic variants: None. Review of the MIP images confirms the above findings CT Brain Perfusion Findings: CBF (<30%) Volume: 0 mL Perfusion (Tmax>6.0s) volume: 5 mL, considered artifactual given location along the inferior surface of the left frontal lobe IMPRESSION: 1. No evidence of acute intracranial abnormality. Mild chronic small vessel ischemia in the cerebral white matter. 2. No large vessel occlusion or significant stenosis in the head or neck. 3. Negative CTP. 4. Mild right-sided cervical lymphadenopathy, nonspecific. No primary neck mass identified. These results were communicated to Dr. Iver NestleBhagat at 12:02 pm on 11/27/2020 by text page via the [facility] messaging system. Electronically Signed   By: Sebastian AcheAllen  Grady M.D.   On: 11/27/2020 12:43    Procedures Procedures   Medications Ordered in ED Medications  iohexol (OMNIPAQUE) 350 MG/ML injection 100 mL (100 mLs Intravenous Contrast Given 11/27/20  1145)  gadobutrol (GADAVIST) 1 MMOL/ML injection 8 mL (8 mLs Intravenous Contrast Given 11/27/20 1211)  ketorolac (TORADOL) 15 MG/ML injection 15 mg (15 mg Intravenous Given 11/27/20 1453)  metoCLOPramide (REGLAN) injection 10 mg (10 mg Intravenous Given 11/27/20 1453)  sodium chloride 0.9 % bolus 500 mL (500 mLs Intravenous New Bag/Given 11/27/20 1451)  valACYclovir (VALTREX) tablet 1,000 mg (1,000 mg Oral Given 11/27/20 1450)  predniSONE (DELTASONE) tablet 60 mg (60 mg Oral Given 11/27/20 1450)    ED Course  I have reviewed the triage vital signs and the nursing notes.  Pertinent labs & imaging results that were available during my care of the patient were reviewed by me and considered in my medical decision making (see chart for details).  Clinical Course as of 11/27/20  1455  Tue Nov 27, 2020  1455 MRI neg for stroke. R sided droop, w/o central sparing. Bells Palsy suspected. Ambulated normally.  The patient appears reasonably screened and/or stabilized for discharge and I doubt any other medical condition or other [facility] requiring further screening, evaluation, or treatment in the ED at this time prior to discharge.  Results from the ER workup discussed with the patient face to face and all questions answered to the best of my ability. The patient is safe for discharge with strict return precautions.   [AN]    Clinical Course User Index [AN] Derwood Kaplan, MD   MDM Rules/Calculators/A&P                          50 year old female comes in with chief complaint of dizziness.  Dizziness is described as ataxia.  There is no dizziness when she is laying flat and her cerebellar exam is normal.  She is also complaining of headache.  Headaches have been intermittently present since yesterday.  At its peak they are 6 out of 10 and she took 3 Aleve for them.  Headaches are not described as thunderclap and they are located right behind her right eye.  No history of migraines.  Additionally patient is noted to have mild right-sided facial droop and some subjective numbness of the right side.  LVO screen is negative.  Stroke concerns present along with possible aneurysm.  Complex migraine is another possibility.  Code stroke was activated on the field.  Neuro team has also assessed the patient independently.     Final Clinical Impression(s) / ED Diagnoses Final diagnoses:  Bell's palsy    Rx / DC Orders ED Discharge Orders          Ordered    hydroxypropyl methylcellulose / hypromellose (ISOPTO TEARS / GONIOVISC) 2.5 % ophthalmic solution  4 times daily PRN        11/27/20 1452    valACYclovir (VALTREX) 1000 MG tablet  3 times daily        11/27/20 1452    predniSONE (DELTASONE) 10 MG tablet  Daily        11/27/20 1452              Derwood Kaplan, MD 11/27/20 1456

## 2020-11-27 NOTE — ED Notes (Signed)
Pt back from MRI at this time

## 2020-11-27 NOTE — ED Triage Notes (Signed)
Pt presents to ED with complaints of dizziness, right sided numbness, headache, nausea and right eye deviation noted, right sided facial droop. LKW 7/4 1700

## 2020-11-27 NOTE — Progress Notes (Signed)
Code stroke 1109 call time 1129 beeper time 1135 exam started 1142 exam finished 1142 images sent to [employer] [facility] [employer] 1145 completed in PACS and [facility] radiology called

## 2020-11-27 NOTE — Discharge Instructions (Addendum)
Take the medicine's prescribed.  Protect your eye by wearing protective sun glasses during wake hours and applying topical artifical tears evert 4-6 hours to the Left eye.

## 2020-11-27 NOTE — ED Notes (Signed)
CBG:90 

## 2020-12-25 ENCOUNTER — Other Ambulatory Visit (HOSPITAL_COMMUNITY): Payer: Self-pay | Admitting: *Deleted

## 2020-12-25 DIAGNOSIS — Z1231 Encounter for screening mammogram for malignant neoplasm of breast: Secondary | ICD-10-CM

## 2020-12-26 ENCOUNTER — Other Ambulatory Visit: Payer: Self-pay | Admitting: *Deleted

## 2020-12-26 DIAGNOSIS — N950 Postmenopausal bleeding: Secondary | ICD-10-CM

## 2020-12-31 ENCOUNTER — Ambulatory Visit (HOSPITAL_COMMUNITY): Payer: Self-pay

## 2021-01-02 ENCOUNTER — Ambulatory Visit (HOSPITAL_COMMUNITY): Payer: Self-pay

## 2021-01-03 ENCOUNTER — Other Ambulatory Visit: Payer: Self-pay

## 2021-01-03 ENCOUNTER — Ambulatory Visit (HOSPITAL_COMMUNITY)
Admission: RE | Admit: 2021-01-03 | Discharge: 2021-01-03 | Disposition: A | Discharge status: Home / Self Care | Payer: Self-pay | Source: Ambulatory Visit | Attending: *Deleted | Admitting: *Deleted

## 2021-01-03 DIAGNOSIS — N950 Postmenopausal bleeding: Secondary | ICD-10-CM | POA: Insufficient documentation

## 2021-01-03 DIAGNOSIS — Z1231 Encounter for screening mammogram for malignant neoplasm of breast: Secondary | ICD-10-CM | POA: Insufficient documentation

## 2021-01-07 ENCOUNTER — Other Ambulatory Visit (HOSPITAL_COMMUNITY): Payer: Self-pay | Admitting: *Deleted

## 2021-01-16 ENCOUNTER — Other Ambulatory Visit (HOSPITAL_COMMUNITY): Payer: Self-pay | Admitting: *Deleted

## 2021-01-16 DIAGNOSIS — R928 Other abnormal and inconclusive findings on diagnostic imaging of breast: Secondary | ICD-10-CM

## 2021-01-18 ENCOUNTER — Other Ambulatory Visit: Payer: Self-pay

## 2021-01-18 ENCOUNTER — Encounter: Payer: Self-pay | Admitting: Obstetrics & Gynecology

## 2021-01-31 ENCOUNTER — Ambulatory Visit: Payer: Self-pay | Admitting: Neurology

## 2021-02-18 ENCOUNTER — Encounter: Payer: Self-pay | Admitting: Obstetrics & Gynecology

## 2021-02-19 ENCOUNTER — Inpatient Hospital Stay (HOSPITAL_COMMUNITY): Admission: RE | Admit: 2021-02-19 | Payer: Self-pay | Source: Ambulatory Visit

## 2021-02-19 ENCOUNTER — Encounter (HOSPITAL_COMMUNITY): Payer: Self-pay

## 2021-02-19 ENCOUNTER — Ambulatory Visit (HOSPITAL_COMMUNITY): Admission: RE | Admit: 2021-02-19 | Payer: Self-pay | Source: Ambulatory Visit

## 2021-02-21 ENCOUNTER — Encounter (HOSPITAL_COMMUNITY): Payer: Self-pay | Admitting: Radiology

## 2021-04-10 ENCOUNTER — Encounter: Payer: Self-pay | Admitting: Obstetrics & Gynecology

## 2022-10-29 IMAGING — US US PELVIS COMPLETE WITH TRANSVAGINAL
1 series · 13 of 25 positions shown · non-contrast
Comparison: None

CLINICAL DATA: Postmenopausal bleeding in [REDACTED] in Monday November, 2020. LMP
3323



[Series 1: us pelvic complete with transvaginal · 13 of 54 slices shown]
[im 1/54]
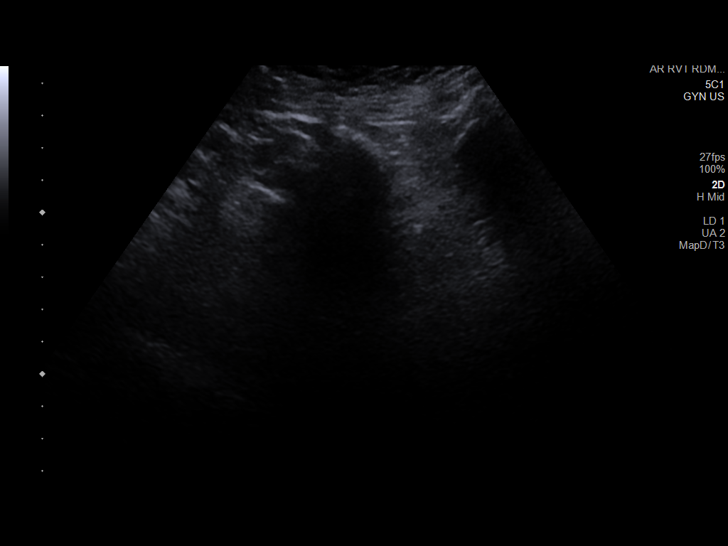
[im 5/54]
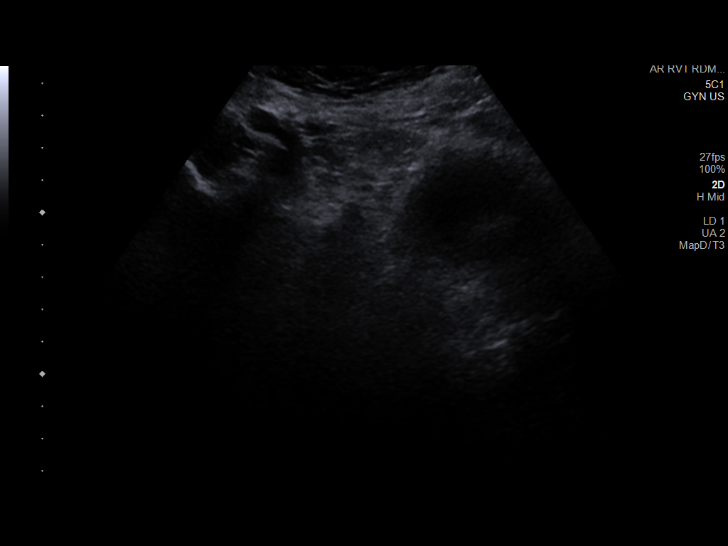
[im 9/54]
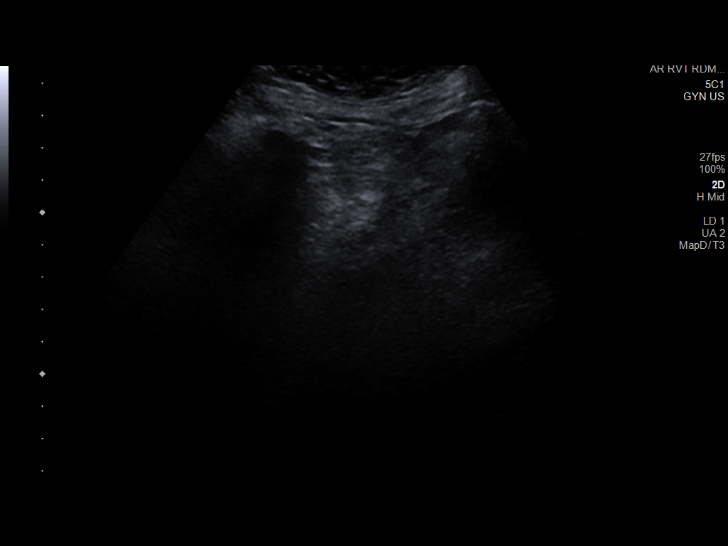
[im 14/54]
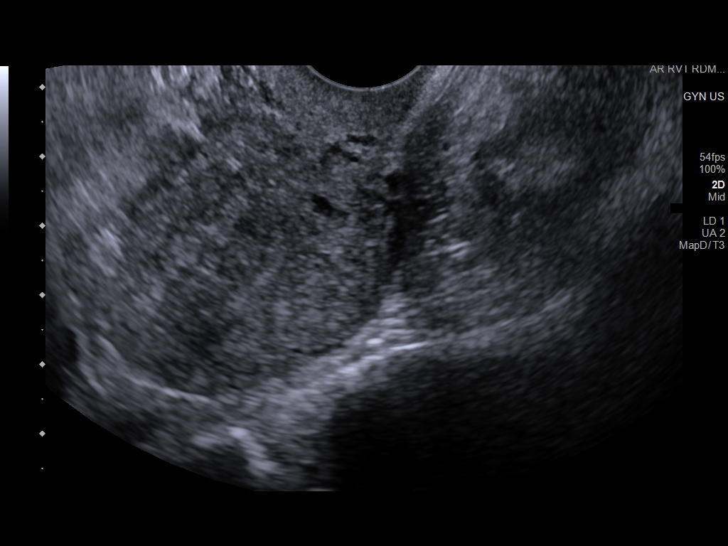
[im 18/54]
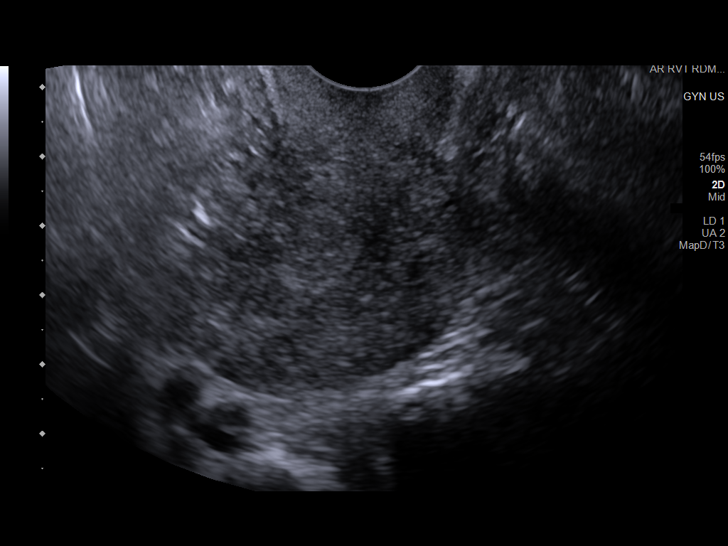
[im 23/54]
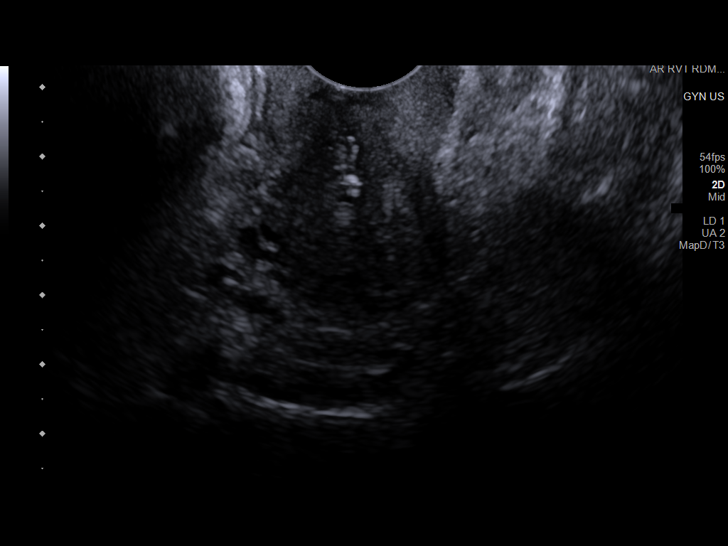
[im 27/54]
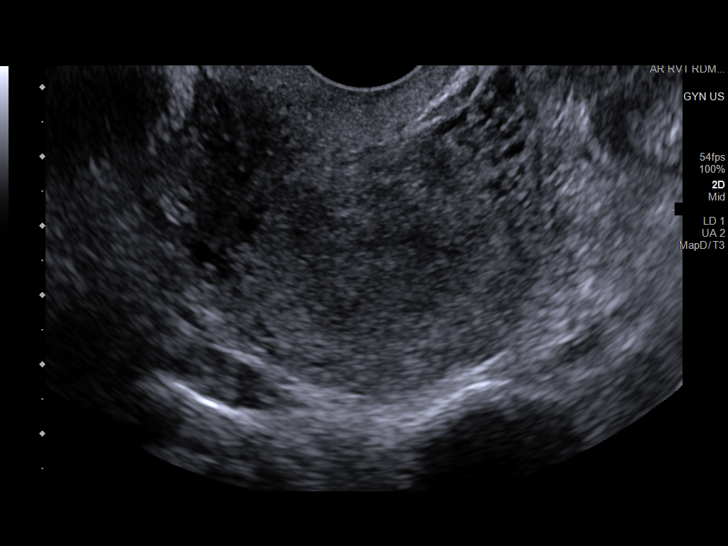
[im 31/54]
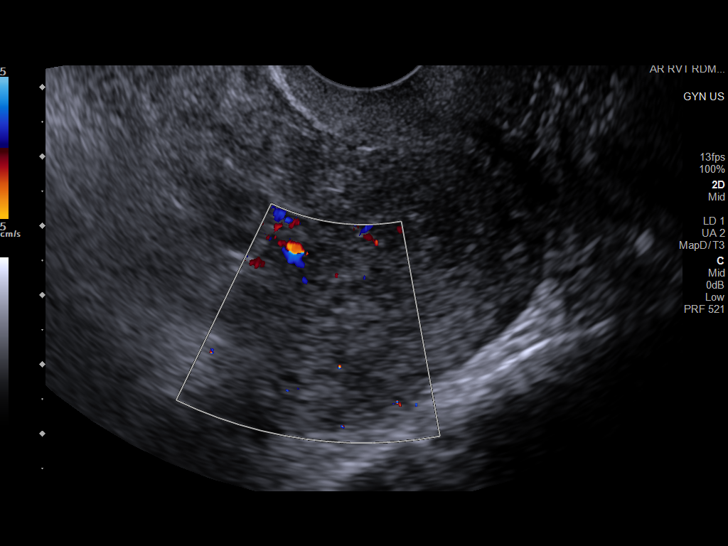
[im 36/54]
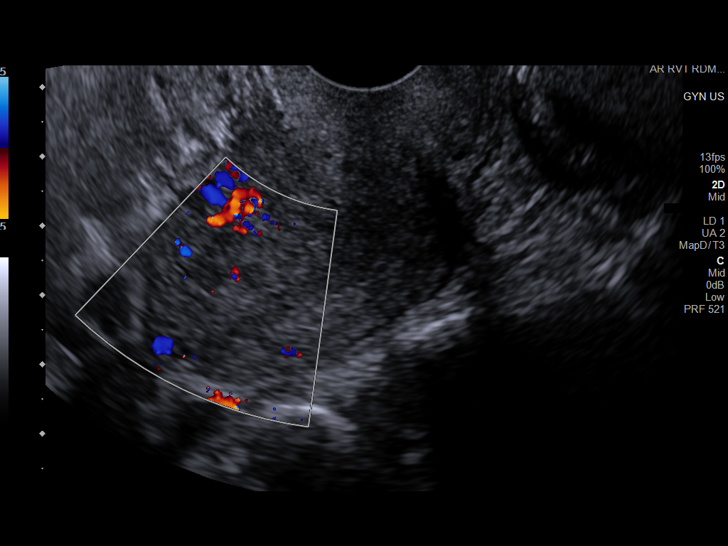
[im 40/54]
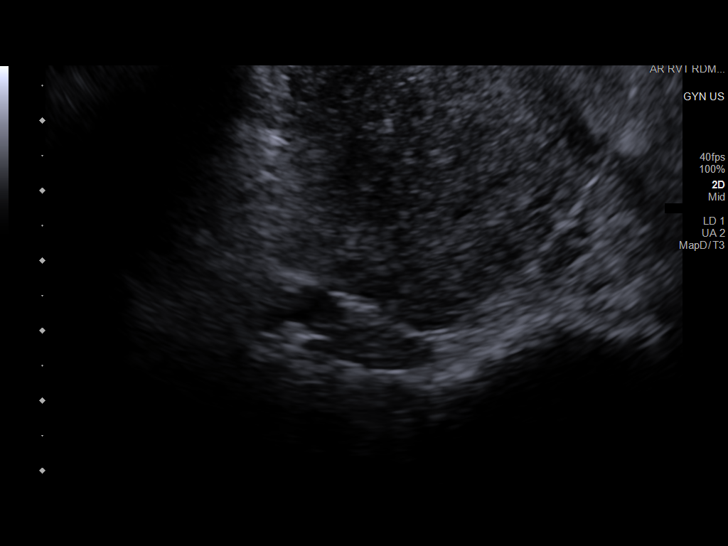
[im 45/54]
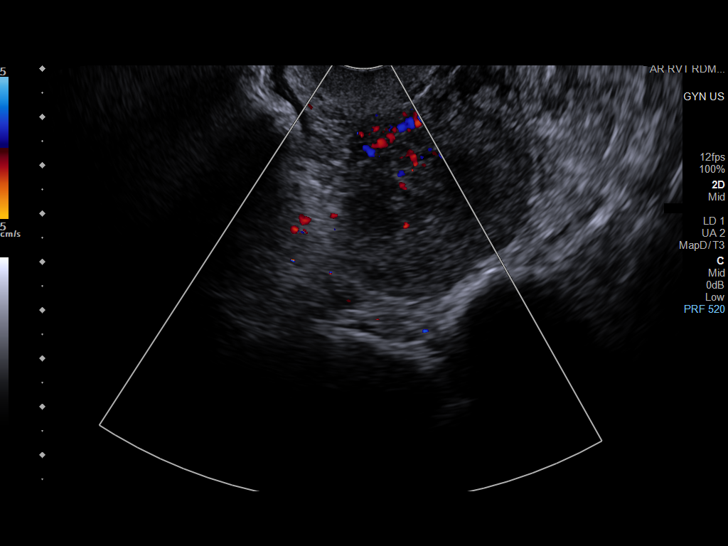
[im 49/54]
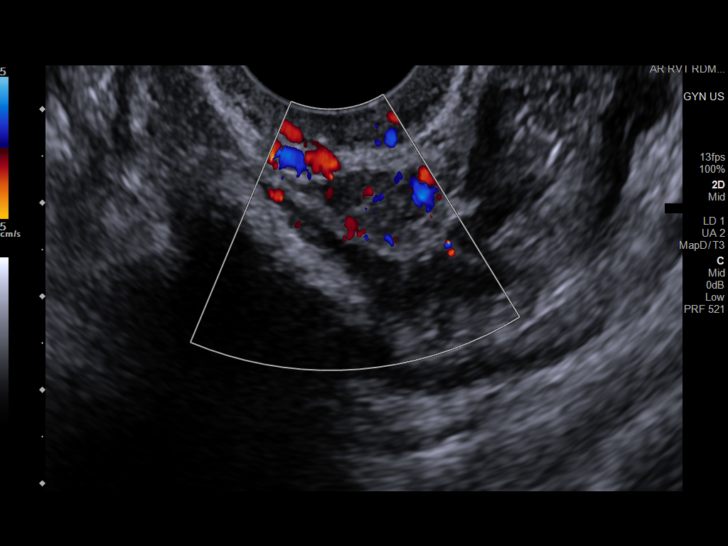
[im 54/54]
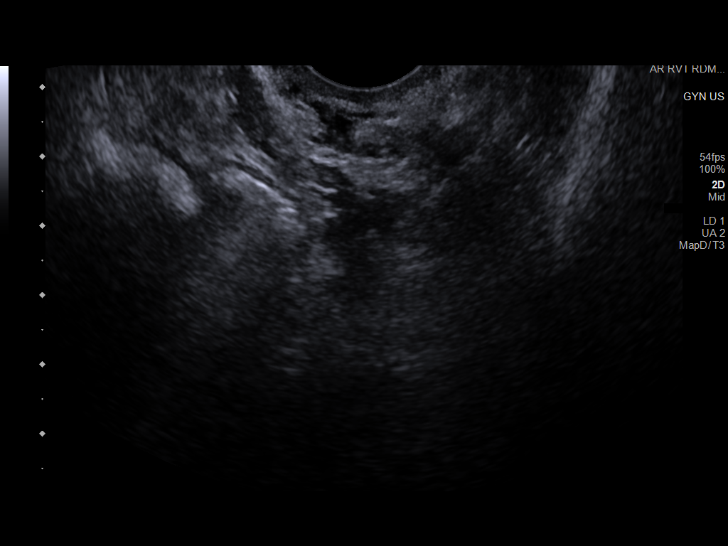

[13 of 25 positions shown; findings below may reference images not displayed]

FINDINGS: Uterus

Measurements: 5.9 x 3.9 x 5.0 cm = volume: 16 mL. Anteverted.
Heterogeneous myometrial echogenicity. No focal without mass

Endometrium

Thickness: Ill-defined, question 11 mm thick. Heterogeneous
appearance. No endometrial fluid or focal mass

Right ovary

Measurements: 1.7 x 1.2 x 1.8 cm = volume: 2.0 mL. Normal morphology
without mass

Left ovary

Measurements: 1.8 x 1.1 x 1.2 cm = volume: 1.2 mL. Normal morphology
without mass

Other findings

Trace free pelvic fluid.  No adnexal masses.
IMPRESSION: Abnormal mildly thickened ill-defined and heterogeneous endometrial
complex; n the setting of post-menopausal bleeding, endometrial
sampling is indicated to exclude carcinoma. If results are benign,
sonohysterogram should be considered for focal lesion work-up. (Ref:
Radiological Reasoning: Algorithmic Workup of Abnormal Vaginal
Bleeding with Endovaginal Sonography and Sonohysterography. AJR
[id number]; 191:S68-73)

Remainder of exam unremarkable.

These results will be called to the ordering clinician or
representative by the Radiologist Assistant, and communication
documented in the PACS or [REDACTED].
